# Patient Record
Sex: Male | Born: 1970 | Race: White | Hispanic: No | Smoking: Never smoker
Health system: Southern US, Community
[De-identification: ages and names within clinical notes are randomized; demographics above are authoritative.]

## PROBLEM LIST (undated history)

## (undated) DIAGNOSIS — K219 Gastro-esophageal reflux disease without esophagitis: Secondary | ICD-10-CM

## (undated) HISTORY — PX: BACK SURGERY: SHX140

## (undated) HISTORY — PX: ROTATOR CUFF REPAIR: SHX139

## (undated) HISTORY — DX: Gastro-esophageal reflux disease without esophagitis: K21.9

---

## 1997-09-13 HISTORY — PX: ARTHROSCOPIC REPAIR ACL: SUR80

## 1998-03-25 ENCOUNTER — Emergency Department (HOSPITAL_COMMUNITY): Admission: EM | Admit: 1998-03-25 | Discharge: 1998-03-25 | Payer: Self-pay | Admitting: Emergency Medicine

## 1998-05-08 ENCOUNTER — Ambulatory Visit (HOSPITAL_BASED_OUTPATIENT_CLINIC_OR_DEPARTMENT_OTHER): Admission: RE | Admit: 1998-05-08 | Discharge: 1998-05-08 | Payer: Self-pay | Admitting: Orthopedic Surgery

## 1999-11-25 ENCOUNTER — Ambulatory Visit (HOSPITAL_COMMUNITY): Admission: RE | Admit: 1999-11-25 | Discharge: 1999-11-25 | Payer: Self-pay | Admitting: Family Medicine

## 1999-11-25 ENCOUNTER — Encounter: Payer: Self-pay | Admitting: Family Medicine

## 1999-12-08 ENCOUNTER — Emergency Department (HOSPITAL_COMMUNITY): Admission: EM | Admit: 1999-12-08 | Discharge: 1999-12-09 | Payer: Self-pay | Admitting: *Deleted

## 1999-12-09 ENCOUNTER — Emergency Department (HOSPITAL_COMMUNITY): Admission: EM | Admit: 1999-12-09 | Discharge: 1999-12-09 | Payer: Self-pay | Admitting: *Deleted

## 2001-05-12 ENCOUNTER — Encounter: Payer: Self-pay | Admitting: Neurosurgery

## 2001-05-12 ENCOUNTER — Inpatient Hospital Stay (HOSPITAL_COMMUNITY): Admission: RE | Admit: 2001-05-12 | Discharge: 2001-05-12 | Payer: Self-pay | Admitting: Neurosurgery

## 2003-11-28 ENCOUNTER — Encounter: Admission: RE | Admit: 2003-11-28 | Discharge: 2003-11-28 | Payer: Self-pay | Admitting: Neurosurgery

## 2003-12-20 ENCOUNTER — Encounter: Admission: RE | Admit: 2003-12-20 | Discharge: 2003-12-20 | Payer: Self-pay | Admitting: Neurosurgery

## 2004-01-09 ENCOUNTER — Encounter: Admission: RE | Admit: 2004-01-09 | Discharge: 2004-01-09 | Payer: Self-pay | Admitting: Neurosurgery

## 2010-06-03 ENCOUNTER — Ambulatory Visit: Payer: Self-pay | Admitting: Family Medicine

## 2010-06-03 DIAGNOSIS — M109 Gout, unspecified: Secondary | ICD-10-CM | POA: Insufficient documentation

## 2010-06-03 DIAGNOSIS — I10 Essential (primary) hypertension: Secondary | ICD-10-CM

## 2010-06-03 DIAGNOSIS — K219 Gastro-esophageal reflux disease without esophagitis: Secondary | ICD-10-CM

## 2010-06-03 DIAGNOSIS — M549 Dorsalgia, unspecified: Secondary | ICD-10-CM | POA: Insufficient documentation

## 2010-06-04 ENCOUNTER — Encounter: Payer: Self-pay | Admitting: Family Medicine

## 2010-08-03 ENCOUNTER — Telehealth: Payer: Self-pay | Admitting: Family Medicine

## 2010-08-11 ENCOUNTER — Telehealth: Payer: Self-pay | Admitting: Family Medicine

## 2010-09-30 ENCOUNTER — Ambulatory Visit
Admission: RE | Admit: 2010-09-30 | Discharge: 2010-09-30 | Payer: Self-pay | Source: Home / Self Care | Attending: Family Medicine | Admitting: Family Medicine

## 2010-09-30 DIAGNOSIS — L0291 Cutaneous abscess, unspecified: Secondary | ICD-10-CM | POA: Insufficient documentation

## 2010-09-30 DIAGNOSIS — L0390 Cellulitis, unspecified: Secondary | ICD-10-CM

## 2010-10-13 NOTE — Progress Notes (Signed)
Summary: exforge   Phone Note Refill Request Message from:  Fax from Pharmacy on August 03, 2010 1:31 PM  Refills Requested: Medication #1:  EXFORGE 5-160 MG TABS take one tablet by mouth daily. Uses [facility]. This was prescribed by patient's previous dr. Is this okay to fill.   Initial call taken by: Melody Comas,  August 03, 2010 1:31 PM  Follow-up for Phone Call        yes ok to refill. Ruthe Mannan MD  August 03, 2010 1:34 PM     Prescriptions: EXFORGE 5-160 MG TABS (AMLODIPINE BESYLATE-VALSARTAN) take one tablet by mouth daily  #90 x 3   Entered by:   Melody Comas   Authorized by:   Ruthe Mannan MD   Signed by:   Melody Comas on 08/03/2010   Method used:   Faxed to ...       [facility] (mail-[facility])             , [place]         Ph: [phone]       Fax: [phone]   RxID:   [id number]

## 2010-10-13 NOTE — Letter (Signed)
Summary: Generic Letter  [place]  [address] [place], [place] [address]   Phone: [phone]  Fax: [phone]    06/04/2010  Jerry Carpenter [address] [place], [place]  [address]  Dear [name],    We have received your lab results and Dr. Dayton Martes says that your (LDL) bad cholesterol looks great!  Keep up the good work.  Enclosed you will find a copy of your lab results.       Sincerely,        Linde Gillis CMA (AAMA)for Dr. Ruthe Mannan, MD

## 2010-10-13 NOTE — Progress Notes (Signed)
Summary: exforge  Phone Note Call from Patient Call back at Home Phone [phone]   Caller: Patient Call For: Ruthe Mannan MD Summary of Call: Received letter from [facility] regarding the exforge rx, they said that he hasn't used [facility] before so he is going to need a written rx for the exforge to mail to [facility]. Please call patient when rx is ready.  Initial call taken by: Melody Comas,  August 11, 2010 1:10 PM  Follow-up for Phone Call        signed printed and given to [facility]. Follow-up by: Eustaquio Boyden  MD,  August 11, 2010 1:14 PM  Additional Follow-up for Phone Call Additional follow up Details #1::        Message left notifying patient to pick up Rx Additional Follow-up by: Janee Morn CMA Duncan Dull),  August 11, 2010 1:29 PM    Prescriptions: EXFORGE 5-160 MG TABS (AMLODIPINE BESYLATE-VALSARTAN) take one tablet by mouth daily  #90 x 3   Entered and Authorized by:   Eustaquio Boyden  MD   Signed by:   Eustaquio Boyden  MD on 08/11/2010   Method used:   Print then Give to Patient   RxID:   [id number]   Appended Document: exforge Patient came in to pick up Rx and was questioning why he had to fax in the hard copy himself because he is a [facility] member and gets his refills from them regularly.  Called [facility] and gave the Rx verbally to [facility]. with no problem.  She did update the patients address in there computer system.

## 2010-10-13 NOTE — Assessment & Plan Note (Signed)
Summary: NEW PATIENT/[facility]   Vital Signs:  Patient profile:   40 year old male Height:      70 inches Weight:      315 pounds BMI:     45.36 Temp:     98.1 degrees F oral Pulse rate:   76 / minute Pulse rhythm:   regular BP sitting:   120 / 90  (right arm) Cuff size:   large  Vitals Entered By: Linde Gillis CMA Duncan Dull) (June 03, 2010 9:29 AM) CC: new patient, establish care   History of Present Illness: 40 yo here to establish care.  HTN-  has been well controlled on Exforge.  Cannot take diuretics due to gout.  Had a cough with ACEI. No CP, blurred vision, SOB, LE edema.  Gout- flares typically in toes, ankles and knees.  Fish is a big trigger for him.  Takes colchicine for flares.  Has not had a bad one in months.  H/o abnormal EKG- per pt, negative myoview in 06/2009 (awaiting records).  No h/o CAD, CP.  Bilateral numbness in buttocks/hip area- occurs after he has been in the car for awhile.  Does not radiate down leg or to groin.  No pain or LE weakness.  No urinary symptoms.  usually goes away after he gets out of the car and walks around for awhile.    Preventive Screening-Counseling & Management  Alcohol-Tobacco     Smoking Status: never  Current Medications (verified): 1)  Exforge 5-160 Mg Tabs (Amlodipine Besylate-Valsartan) .... Take One Tablet By Mouth Daily  Allergies (verified): 1)  ! 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 Divorced Never Smoked Alcohol use-yes  Risk Factors: Smoking Status: never (06/03/2010)  Past Medical History: abnormal EKG- followed by Dr. 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 Divorced Never Smoked Alcohol use-yes Smoking Status:  never  Review of Systems      See HPI General:  Denies malaise. Eyes:  Denies blurring. ENT:  Denies difficulty swallowing. CV:  Denies chest pain or discomfort. Resp:  Denies shortness of breath. GI:  Denies abdominal pain, nausea, and vomiting. GU:  Denies dysuria, incontinence, urinary frequency, and urinary hesitancy. MS:  Complains of low back pain; denies loss of strength. Derm:  Denies rash. Neuro:  Denies falling down and headaches. Psych:  Denies anxiety and depression. Endo:  Denies cold intolerance and heat intolerance. Heme:  Denies abnormal bruising and bleeding.  Physical Exam  General:  alert and overweight-appearing.   Head:  normocephalic and atraumatic.   Eyes:  vision grossly intact, pupils equal, pupils round, and pupils reactive to light.   Ears:  R ear normal and L ear normal.   Nose:  no external deformity.   Mouth:  good dentition.   Lungs:  Normal respiratory effort, chest expands symmetrically. Lungs are clear to auscultation, no crackles or wheezes. Heart:  Normal rate and regular rhythm. S1 and S2 normal without gallop, murmur, click, rub or other extra sounds. Abdomen:  Bowel  sounds positive,abdomen soft and non-tender without masses, organomegaly or hernias noted. Msk:  No deformity or scoliosis noted of thoracic or lumbar spine.   SLR, fabers, neg bilaterally. Strength equal in four extremities. Extremities:  no edema Neurologic:  alert & oriented X3, gait normal, and DTRs symmetrical and normal.   Skin:  Intact without suspicious lesions or rashes Psych:  Cognition and judgment appear intact. Alert and cooperative with normal attention span and  concentration. No apparent delusions, illusions, hallucinations   Impression & Recommendations:  Problem # 1:  GOUT (ICD-274.9) Assessment Unchanged Currently stable.  Continue as needed colchicine.  Discussed keeping journal of triggers if symptoms become more frequent.  Problem # 2:  HYPERTENSION (ICD-401.9) Assessment: Unchanged Stable on Exforge.  continue current dose.  Awaiting records for recent labs. His updated medication list for this problem includes:    Exforge 5-160 Mg Tabs (Amlodipine besylate-valsartan) .Marland Kitchen... Take one tablet by mouth daily  Problem # 3:  FAMILY HISTORY OF CAD MALE 1ST DEGREE RELATIVE <50 (ICD-V17.3) Assessment: Unchanged Strong FH of CAD, will recheck direct LDL today.  Problem # 4:  BACK PAIN (ICD-724.5) Assessment: New Most consistent with spasm/piriformis sydrome.  Discussed stretches and NSAIDs.  Pt in agreement with plan.  Complete Medication List: 1)  Exforge 5-160 Mg Tabs (Amlodipine besylate-valsartan) .... Take one tablet by mouth daily  Other Orders: TLB-Cholesterol, Direct LDL (83721-DIRLDL) Venipuncture (44010) Flu Vaccine 66yrs + (27253) Admin 1st Vaccine (66440)  Current Allergies (reviewed today): ! ACE INHIBITORS   Immunization History:  Hepatitis B Immunization History:    Hepatitis B # 1:  historical (01/07/1997)    Hepatitis B # 2:  historical (02/19/1997)    Hepatitis B # 3:  historical (07/17/1997)    Hepatitis B # 4:  historical (08/26/2008)  DPT Immunization History:    DPT # 1:  historical (10/28/2005)  Immunizations Administered:  Influenza Vaccine # 1:    Vaccine Type: Fluvax 3+    Site: left deltoid    Mfr: GlaxoSmithKline    Dose: 0.5 ml    Route: IM    Given by: Linde Gillis CMA (AAMA)    Exp. Date: 03/13/2011    Lot #: [id number]    VIS given: 04/07/10 version given June 03, 2010.   Prevention & Chronic Care Immunizations   Influenza vaccine: Fluvax 3+  (06/03/2010)    Tetanus booster:  06/02/2006: historical   Tetanus booster due: 06/02/2016    Pneumococcal vaccine: Not documented  Other Screening   Smoking status: never  (06/03/2010)  Lipids   Total Cholesterol: Not documented   Lipid panel action/deferral: LDL Direct Ordered   LDL: Not documented   LDL Direct: Not documented   HDL: Not documented   Triglycerides: Not documented  Hypertension   Last Blood Pressure: 120 / 90  (06/03/2010)   Serum creatinine: Not documented   Serum potassium Not documented  Self-Management Support :    Hypertension self-management support: Not documented   Nursing Instructions: Give Flu vaccine today    TD Result Date:  06/02/2006 TD Result:  historical

## 2010-10-15 NOTE — Assessment & Plan Note (Signed)
Summary: KNOT ON RIGHT ARM TRICEP IN THE MUSCLE / LFW   Vital Signs:  Patient profile:   41 year old male Height:      70 inches Weight:      322 pounds BMI:     46.37 Temp:     97.9 degrees F oral Pulse rate:   75 / minute Pulse rhythm:   regular BP sitting:   120 / 82  (right arm) Cuff size:   large  Vitals Entered By: Linde Gillis CMA Duncan Dull) (September 30, 2010 11:32 AM) CC: knot on right arm   History of Present Illness: 40 yo here for knot on right for several days.  Right above right elbow- was very large when it started, become very red and warm to touch. Painful.  Felt a little feverish. No nausea or vomiting.  Has not placed warm compresses on it. Pain has improved, redness has resolved.     Current Medications (verified): 1)  Exforge 5-160 Mg Tabs (Amlodipine Besylate-Valsartan) .... Take One Tablet By Mouth Daily 2)  Septra Ds 800-160 Mg Tabs (Sulfamethoxazole-Trimethoprim) .Marland Kitchen.. 1 By Mouth Twice Daily X 10 Days 3)  Cialis 20 Mg Tabs (Tadalafil) .Marland Kitchen.. 1 Tablet Every Other Day As Needed For Erectile Dysfunction  Allergies: 1)  ! Ace Inhibitors  Past History:  Past Medical History: Last updated: 06/03/2010 abnormal EKG- followed by Dr. 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 Divorced Never Smoked Alcohol use-yes  Risk Factors: Smoking Status: never (06/03/2010)  Review of Systems      See HPI General:  Complains of chills and malaise. GI:  Denies nausea and vomiting. Derm:  Denies poor wound healing.  Physical Exam  General:  alert and overweight-appearing.   VSS,  afebrile Psych:  Right upper arm- palpable subcutaneous 2.5 cm mass, non fluctuant, no overlying warmth or erythema, mildly TTP   Impression & Recommendations:  Problem # 1:  ABSCESS, SKIN (ICD-682.9) Assessment New Most likely an absecess with resolving cellulitis. Although use of abx in a case like this is controversial, will try given how deep the mass is. Advised warm compresses. See pt instructions. His updated medication list for this problem includes:    Septra Ds 800-160 Mg Tabs (Sulfamethoxazole-trimethoprim) .Marland Kitchen... 1 by mouth twice daily x 10 days  Complete Medication List: 1)  Exforge 5-160 Mg Tabs (Amlodipine besylate-valsartan) .... Take one tablet by mouth daily 2)  Septra Ds 800-160 Mg Tabs (Sulfamethoxazole-trimethoprim) .Marland Kitchen.. 1 by mouth twice daily x 10 days 3)  Cialis 20 Mg Tabs (Tadalafil) .Marland Kitchen.. 1 tablet every other day as needed for erectile dysfunction  Patient Instructions: 1)  Please call me on Friday with an update. 2)  Come here or to urgent sooner if you develop fever, vomiting. Prescriptions: CIALIS 20 MG TABS (TADALAFIL) 1 tablet every other day as needed for erectile dysfunction  #30 x 3   Entered and Authorized by:   Ruthe Mannan MD   Signed by:   Ruthe Mannan MD on 09/30/2010   Method used:   Faxed to ...       [facility] (mail-[facility])             , [place]  Ph: [phone]       Fax: [phone]   RxID:   [id number] SEPTRA DS 800-160 MG TABS (SULFAMETHOXAZOLE-TRIMETHOPRIM) 1 by mouth twice daily x 10 days  #20 x 0   Entered and Authorized by:   Ruthe Mannan MD   Signed by:   Ruthe Mannan MD on 09/30/2010   Method used:   Electronically to        [facility] [id number] (retail[facility]       [address]       [place], [place]  [address]       Ph: [phone] or [phone]       Fax: [phone]   RxID:   [id number]    Orders Added: 1)  Est. Patient Level IV [01601]    Current Allergies (reviewed today): ! ACE INHIBITORS

## 2011-01-26 ENCOUNTER — Other Ambulatory Visit: Payer: Self-pay | Admitting: *Deleted

## 2011-01-26 MED ORDER — COLCHICINE 0.6 MG PO TABS: 0.6000 mg | ORAL_TABLET | Freq: Every day | ORAL | Status: DC

## 2011-01-26 NOTE — Telephone Encounter (Signed)
Pt is asking that a script for colchicine be sent to [facility].  He says he has gotten this from you before but I dont see it on his med list.

## 2011-01-26 NOTE — Telephone Encounter (Signed)
Rx sent 

## 2011-01-29 NOTE — Op Note (Signed)
[employer]  Patient:    Jerry Carpenter, Jerry Carpenter Visit Number: [id number] MRN: [id number]          Service Type: Attending:  Julio Sicks, M.D. Dictated by:   Julio Sicks, M.D. Proc. 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  PROCEDURE:  Left L5-S1 laminotomy and microdiskectomy.  SURGEON:  Julio Sicks, M.D.  ASSISTANT:  Reinaldo Meeker, M.D.  ANESTHESIA:  General endotracheal.  INDICATIONS:  [name] is a 40 year old male with history of back and left lower extremity pain (with left-sided S1 radiculopathy).  This failed conservative management.  MRI scanning demonstrates a very large and left paracentral disk herniation, with compression of the cauda equina and left-sided S1 nerve root. The patient has been counseled as to the options and benefits.  Decided to proceed with the L5-S1 laminotomy and microdiskectomy for hopeful improvement of his symptoms.  OPERATIVE NOTE:  The patient taken to the operating room and placed in the supine position.  After adequate level of anesthesia was achieved, the patient was positioned prone onto Wilson frame and appropriately padded.  Preparation of the lumbar region was shaved, prepped and draped sterilely.  We then made a ______ linear skin incision overlying the L5-S1 interspace.  This was carried down sharply in the midline.  Subperiosteal dissection was performed on the left side, exposing the lamina and facet joints at L5-S1.  Deep self-retaining retractor was placed.  Intraoperative x-rays were taken.  The level was confirmed.  The laminotomy was then performed using a high-speed drill and Kerrison rongeurs ______ of the lamina at L5.  The medial edge of the L5-S1 facet joint and superior rim of the S1 lamina at the  ligament of Flavum was then elevated and resected in piecemeal fashion using Kerrison rongeurs, with the underlying thecal sac.  Next, the S1 nerve root was clearly identified.  Microscope was brought into the field and used for microdissection of the left-sided S1 nerve root and underlying disk herniation.  Epidural venous plexus was coagulated and cut. Thecal sac and S4 nerve root were mobilized and tracked towards the midline. The disk herniation was readily apparent.  We then used a size 15 blade to remove in piecemeal fashion.  A large amount of disk herniation then extruded at this point.  This was removed using pituitary rongeurs upward and Epstein curets.  All loose or obvious seen degenerative disk herniations were removed from the interspace.  All muscles of the disk herniation were completely resected.  At this point a very thorough diskectomy had been performed.  There was no loose debris and complete decompression of the thecal sac or nerve roots.  The wound was then copiously irrigated with saline solution.  The wound was inspected one final time for hemostasis, found to be good.  The microscope and retractors were removed.  Hemostasis of the musculature achieved with electrocautery.  The wound was then closed in layers with Vicryl sutures.  Steri-Strips and a sterile dressing were applied.  There were no intraoperative complications.  The patient tolerated the procedure well and he was returned to the recovery room postop. Dictated by:   Julio Sicks, M.D. Attending:  Julio Sicks, M.D. DD:  05/12/01 TD:  05/12/01  Job: [id number]

## 2011-02-11 ENCOUNTER — Other Ambulatory Visit: Payer: Self-pay | Admitting: *Deleted

## 2011-02-11 MED ORDER — COLCHICINE 0.6 MG PO TABS: 0.6000 mg | ORAL_TABLET | Freq: Every day | ORAL | Status: DC

## 2011-02-11 NOTE — Telephone Encounter (Signed)
Pt states the script that was sent in on 5/15 was cancelled because he owed [facility] a payment, which has since been paid.  Also, pt is asking for a 90 day supply.

## 2011-02-19 ENCOUNTER — Encounter: Payer: Self-pay | Admitting: Family Medicine

## 2011-02-23 ENCOUNTER — Encounter: Payer: Self-pay | Admitting: Family Medicine

## 2011-02-23 ENCOUNTER — Ambulatory Visit (INDEPENDENT_AMBULATORY_CARE_PROVIDER_SITE_OTHER): Payer: BC Managed Care – PPO | Admitting: Family Medicine

## 2011-02-23 VITALS — BP 130/90 | HR 76 | Temp 97.9°F | Ht 70.0 in | Wt 317.5 lb

## 2011-02-23 DIAGNOSIS — R0989 Other specified symptoms and signs involving the circulatory and respiratory systems: Secondary | ICD-10-CM

## 2011-02-23 DIAGNOSIS — R0683 Snoring: Secondary | ICD-10-CM

## 2011-02-23 DIAGNOSIS — R195 Other fecal abnormalities: Secondary | ICD-10-CM

## 2011-02-23 DIAGNOSIS — R079 Chest pain, unspecified: Secondary | ICD-10-CM | POA: Insufficient documentation

## 2011-02-23 NOTE — Progress Notes (Signed)
  History of Present Illness:  40 yo here for several issues: 1.  Hospital follow up: Per pt, was admitted to [facility] last week for chest pain. Chest pain has resolved.  Per pt, CE neg, stress test neg. Was told it was reflux/MSK. Symptoms have resolved.  2.  GI issues- Since taking colchicine, bowels have been loose.  Noticed that when he doesn't take it, he still has frequent bowel movements and he is concerned there may be another issue. No abdominal pain.  No nausea.  No visible blood in stool. Admits to not eating well, greasy/fast food.  3.  Snoring- as he is gaining weight, snoring is getting worse. Also feels like even after he gets a full night of sleep, very tired during the day. Can fall asleep in the middle of meetings, etc. Does have h/o HTN.   The PMH, PSH, Social History, Family History, Medications, and allergies have been reviewed in [facility], and have been updated if relevant.  Review of Systems  See HPI  General: Complains of chills and malaise.  GI: Denies nausea and vomiting.  Derm: Denies poor wound healing.    Physical Exam  BP 130/90  Pulse 76  Temp(Src) 97.9 F (36.6 C) (Oral)  Ht 5\' 10"  (1.778 m)  Wt 317 lb 8 oz (144.017 kg)  BMI 45.56 kg/m2  General: alert and overweight-appearing.  HEENT:  Neck supple, no adenopathy CVS:  RRR Resp:  CTA bilaterally Abd:  Obese, pos BS, NT Psych: Right upper arm- palpable subcutaneous 2.5 cm mass, non fluctuant, no overlying warmth or erythema, mildly TTP  1. Snoring   Given body habitus, HTN and hypersomnia, will refer to pulmonary for sleep study to rule out sleep apnea.    2. Loose stools  Likely multifactorial- related to combination of colchicine and poor diet. Will get IFOB to rule out any microscopic bleeding.   The patient indicates understanding of these issues and agrees with the plan.    3. Chest pain  Resolved.  Awaiting records from [facility]. Per pt, cardiac work up was extensive and negative. No  further work up is likely necessary.

## 2011-02-23 NOTE — Patient Instructions (Signed)
Good to see you. Please stop by to see Jerry Carpenter on your way out. 

## 2011-02-26 ENCOUNTER — Encounter: Payer: Self-pay | Admitting: *Deleted

## 2011-02-26 ENCOUNTER — Other Ambulatory Visit: Payer: Self-pay | Admitting: Family Medicine

## 2011-02-26 ENCOUNTER — Other Ambulatory Visit: Payer: BC Managed Care – PPO

## 2011-02-26 DIAGNOSIS — Z1211 Encounter for screening for malignant neoplasm of colon: Secondary | ICD-10-CM

## 2011-02-26 LAB — FECAL OCCULT BLOOD, IMMUNOCHEMICAL: Fecal Occult Bld: NEGATIVE

## 2011-03-15 ENCOUNTER — Encounter: Payer: Self-pay | Admitting: Pulmonary Disease

## 2011-03-15 ENCOUNTER — Ambulatory Visit (INDEPENDENT_AMBULATORY_CARE_PROVIDER_SITE_OTHER): Payer: BC Managed Care – PPO | Admitting: Pulmonary Disease

## 2011-03-15 VITALS — BP 132/86 | HR 70 | Temp 97.7°F | Ht 70.0 in | Wt 322.0 lb

## 2011-03-15 DIAGNOSIS — G4733 Obstructive sleep apnea (adult) (pediatric): Secondary | ICD-10-CM

## 2011-03-15 NOTE — Assessment & Plan Note (Signed)
The pt's history is very suggestive of osa.  He has is obese, has a large neck, snores, has nonrestorative sleep, and has significant daytime sleepiness.  I have had a long discussion with the pt about sleep apnea, including its impact on QOL and CV health.  I think he needs a sleep study for diagnosis, and the pt is agreeable.

## 2011-03-15 NOTE — Progress Notes (Signed)
  Subjective:    Patient ID: Jerry Carpenter, male    DOB: [birth year], 40 y.o.   MRN: [id number]  HPI The pt is a 40y/o male who I have been asked to see for possible osa.  His history is significant for: -loud snoring, no bedpartner currently to note apneas -nonrestorative sleep, and significant daytime sleepiness with any period of inactivity.  He can fall asleep with meetings, conversations, stoplights, and long distance daytime driving.  He gets his "second wind" in the evenings -weight up 20 pounds in last two years, and epworth very abnormal at 18  Sleep Questionnaire: What time do you typically go to bed?( Between what hours) 11pm to 12:30 am How long does it take you to fall asleep? within minutes How many times during the night do you wake up? 0 What time do you get out of bed to start your day? 0530 Do you drive or operate heavy machinery in your occupation? No How much has your weight changed (up or down) over the past two years? (In pounds) 20 lb (9.072 kg) Have you ever had a sleep study before? No Do you currently use CPAP? No Do you wear oxygen at any time? No     Review of Systems  Constitutional: Negative for fever and unexpected weight change.  HENT: Positive for congestion. Negative for ear pain, nosebleeds, sore throat, rhinorrhea, sneezing, trouble swallowing, dental problem, postnasal drip and sinus pressure.   Eyes: Negative for redness and itching.  Respiratory: Positive for shortness of breath. Negative for cough, chest tightness and wheezing.   Cardiovascular: Positive for leg swelling. Negative for palpitations.  Gastrointestinal: Negative for nausea and vomiting.  Genitourinary: Negative for dysuria.  Musculoskeletal: Positive for joint swelling.  Skin: Negative for rash.  Neurological: Positive for headaches.  Hematological: Does not bruise/bleed easily.  Psychiatric/Behavioral: Negative for dysphoric mood. The patient is not nervous/anxious.        Objective:   Physical Exam Constitutional: obese male, no acute distress  HENT:  Nares patent without discharge, but deviated septum to right with narrowing  Oropharynx without exudate, palate and uvula are very large with excessive soft tissue posteriorly  Eyes:  Perrla, eomi, no scleral icterus  Neck:  No JVD, no TMG.  Very large neck  Cardiovascular:  Normal rate, regular rhythm, no rubs or gallops.  No murmurs        Intact distal pulses  Pulmonary :  Normal breath sounds, no stridor or respiratory distress   No rales, rhonchi, or wheezing  Abdominal:  Soft, nondistended, bowel sounds present.  No tenderness noted.   Musculoskeletal:  No lower extremity edema noted.  Lymph Nodes:  No cervical lymphadenopathy noted  Skin:  No cyanosis noted  Neurologic:  Alert, appropriate, moves all 4 extremities without obvious deficit.         Assessment & Plan:

## 2011-03-15 NOTE — Patient Instructions (Addendum)
Will schedule for sleep study, and arrange for followup once results are available.   Work on weight loss.

## 2011-03-16 ENCOUNTER — Ambulatory Visit (HOSPITAL_BASED_OUTPATIENT_CLINIC_OR_DEPARTMENT_OTHER): Payer: BC Managed Care – PPO | Attending: Pulmonary Disease

## 2011-03-16 DIAGNOSIS — G4733 Obstructive sleep apnea (adult) (pediatric): Secondary | ICD-10-CM | POA: Insufficient documentation

## 2011-03-18 DIAGNOSIS — G4733 Obstructive sleep apnea (adult) (pediatric): Secondary | ICD-10-CM

## 2011-03-19 NOTE — Procedures (Signed)
Jerry Carpenter, Jerry Carpenter                 ACCOUNT NO.:  [id number]  MEDICAL RECORD NO.:  [id number]          PATIENT TYPE:  OUT  LOCATION:  SLEEP CENTER                 FACILITY:  [facility]  PHYSICIAN:  Barbaraann Share, MD,FCCPDATE OF BIRTH:  02-Dec-1969  DATE OF STUDY:  03/16/2011                           NOCTURNAL POLYSOMNOGRAM  REFERRING PHYSICIAN:  Barbaraann Share, MD,FCCP  INDICATION FOR STUDY:  Hypersomnia with sleep apnea.  EPWORTH SLEEPINESS SCORE:  17.  MEDICATIONS:  SLEEP ARCHITECTURE:  The patient had a total sleep time of 293 minutes with very little slow wave sleep and only 26 minutes of REM.  Sleep onset latency was normal at 5 minutes, and REM onset was at the upper limits of normal at 127 minutes.  Sleep efficiency was moderately reduced at 75%.  RESPIRATORY DATA:  The patient was found to have 80 obstructive apneas and 119 obstructive hypopneas, giving him an apnea/hypopnea index of 41 events per hour.  The events occurred primarily in the supine position, and there was moderate to loud snoring noted throughout.  OXYGEN DATA:  There was O2 desaturation as low as 79% with the patient's obstructive events.  CARDIAC DATA:  No clinically significant arrhythmias were noted.  MOVEMENT-PARASOMNIA:  The patient had no significant leg jerks or other abnormal behavior seen.  IMPRESSIONS-RECOMMENDATIONS:  Severe obstructive sleep apnea/hypopnea syndrome with an apnea/hypopnea index of 41 events per hour and O2 desaturation as low as 79%.  Treatment for this degree of sleep apnea should focus primarily on weight loss as well as CPAP.     Barbaraann Share, MD,FCCP Diplomate, American Board of Sleep Medicine Electronically Signed    [facility]  D:  03/18/2011 15:22:40  T:  03/19/2011 05:12:43  Job:  [id number]

## 2011-03-23 ENCOUNTER — Encounter (HOSPITAL_BASED_OUTPATIENT_CLINIC_OR_DEPARTMENT_OTHER): Payer: BC Managed Care – PPO

## 2011-03-30 ENCOUNTER — Encounter (HOSPITAL_BASED_OUTPATIENT_CLINIC_OR_DEPARTMENT_OTHER): Payer: BC Managed Care – PPO

## 2011-03-31 ENCOUNTER — Ambulatory Visit: Payer: BC Managed Care – PPO | Admitting: Pulmonary Disease

## 2011-04-23 ENCOUNTER — Ambulatory Visit: Payer: BC Managed Care – PPO | Admitting: Pulmonary Disease

## 2011-04-26 ENCOUNTER — Encounter: Payer: Self-pay | Admitting: Pulmonary Disease

## 2011-04-26 ENCOUNTER — Ambulatory Visit (INDEPENDENT_AMBULATORY_CARE_PROVIDER_SITE_OTHER): Payer: BC Managed Care – PPO | Admitting: Pulmonary Disease

## 2011-04-26 VITALS — BP 130/78 | HR 78 | Temp 97.5°F | Ht 70.0 in | Wt 327.6 lb

## 2011-04-26 DIAGNOSIS — G4733 Obstructive sleep apnea (adult) (pediatric): Secondary | ICD-10-CM

## 2011-04-26 NOTE — Assessment & Plan Note (Signed)
The patient has been found to have severe obstructive sleep apnea by his recent sleep study.  His best treatment at this time would be weight loss while wearing CPAP, and he is amenable to this.  I will set the patient up on cpap at a moderate pressure level to allow for desensitization, and will troubleshoot the device over the next 4-6weeks if needed.  The pt is to call me if having issues with tolerance.  Will then optimize the pressure once patient is able to wear cpap on a consistent basis.

## 2011-04-26 NOTE — Patient Instructions (Signed)
Will start on cpap.  Please call if having tolerance issues. Work on weight loss followup with me in 5 weeks.  

## 2011-04-26 NOTE — Progress Notes (Signed)
  Subjective:    Patient ID: Jerry Carpenter, male    DOB: 05/20/1971, 40 y.o.   MRN: [id number]  HPI The patient comes in today for followup after his recent sleep study.  He was found to have severe obstructive sleep apnea, with an AHI of 41 events per hour.  I have reviewed his sleep study in detail with him, and answered all of his questions.   Review of Systems  Constitutional: Negative for fever and unexpected weight change.  HENT: Negative for ear pain, nosebleeds, congestion, sore throat, rhinorrhea, sneezing, trouble swallowing, dental problem, postnasal drip and sinus pressure.   Eyes: Negative for redness and itching.  Respiratory: Negative for cough, chest tightness, shortness of breath and wheezing.   Cardiovascular: Negative for palpitations and leg swelling.  Gastrointestinal: Negative for nausea and vomiting.  Genitourinary: Negative for dysuria.  Musculoskeletal: Negative for joint swelling.  Skin: Negative for rash.  Neurological: Negative for headaches.  Hematological: Does not bruise/bleed easily.  Psychiatric/Behavioral: Negative for dysphoric mood. The patient is not nervous/anxious.        Objective:   Physical Exam Obese male in no acute distress Nose without obvious discharge or purulence Lower extremities without edema, no cyanosis present Alert and oriented, moves all 4 extremities.       Assessment & Plan:

## 2011-05-31 ENCOUNTER — Ambulatory Visit (INDEPENDENT_AMBULATORY_CARE_PROVIDER_SITE_OTHER): Payer: BC Managed Care – PPO | Admitting: Pulmonary Disease

## 2011-05-31 ENCOUNTER — Encounter: Payer: Self-pay | Admitting: Pulmonary Disease

## 2011-05-31 VITALS — BP 130/80 | HR 77 | Temp 97.7°F | Ht 70.0 in | Wt 329.0 lb

## 2011-05-31 DIAGNOSIS — G4733 Obstructive sleep apnea (adult) (pediatric): Secondary | ICD-10-CM

## 2011-05-31 NOTE — Progress Notes (Signed)
  Subjective:    Patient ID: Jerry Carpenter, male    DOB: 08/11/1971, 40 y.o.   MRN: [id number]  HPI The patient comes in today for followup of his obstructive sleep apnea.  He has been wearing CPAP compliantly since last visit, and denies any issues with his mask fit.  He is having an issue with pulling the mask off during the night, and feels that he is having breakthrough events.  I have explained that we have yet to optimize his pressure for him.  Even with this, he has seen an improvement in his sleep, and also his daytime alertness.   Review of Systems  Constitutional: Negative for fever and unexpected weight change.  HENT: Positive for congestion and sore throat. Negative for ear pain, nosebleeds, rhinorrhea, sneezing, trouble swallowing, dental problem, postnasal drip and sinus pressure.   Eyes: Positive for redness and itching.  Respiratory: Positive for cough. Negative for chest tightness, shortness of breath and wheezing.   Cardiovascular: Negative for palpitations and leg swelling.  Gastrointestinal: Negative for nausea and vomiting.  Genitourinary: Negative for dysuria.  Musculoskeletal: Negative for joint swelling.  Skin: Negative for rash.  Neurological: Negative for headaches.  Hematological: Does not bruise/bleed easily.  Psychiatric/Behavioral: Negative for dysphoric mood. The patient is not nervous/anxious.        Objective:   Physical Exam An obese male in no acute distress No skin breakdown or pressure necrosis from CPAP mask Nose without purulence or discharge noted Chest clear to auscultation Cardiac exam with regular rate and rhythm Lower extremities without edema, no cyanosis noted Alert and oriented, does not appear to be sleepy, moves all 4 extremities.       Assessment & Plan:

## 2011-05-31 NOTE — Assessment & Plan Note (Signed)
The patient overall has adapted well to CPAP, and has seen improvement in his sleep and daytime alertness.  I suspect he is pulling his mask off during the night because of breakthrough apneas, and I have explained to him we need to optimize his pressure at this time.  I have also encouraged him to work aggressively on weight loss. Care Plan:  At this point, will arrange for the patient's machine to be changed over to auto mode for 2 weeks to optimize their pressure.  I will review the downloaded data once sent by dme, and also evaluate for compliance, leaks, and residual osa.  I will call the patient and dme to discuss the results, and have the patient's machine set appropriately.  This will serve as the pt's cpap pressure titration.

## 2011-05-31 NOTE — Patient Instructions (Signed)
We'll arrange to have your CPAP pressure optimized over the next few weeks.  I will call you once I receive your download results Work on weight loss Followup with me in 6 months, or sooner if having issues.

## 2011-06-22 ENCOUNTER — Other Ambulatory Visit: Payer: Self-pay | Admitting: Family Medicine

## 2011-06-22 NOTE — Telephone Encounter (Signed)
Will route to PCP 

## 2011-06-23 ENCOUNTER — Ambulatory Visit: Payer: BC Managed Care – PPO | Admitting: Family Medicine

## 2011-06-24 ENCOUNTER — Ambulatory Visit (INDEPENDENT_AMBULATORY_CARE_PROVIDER_SITE_OTHER): Payer: BC Managed Care – PPO | Admitting: Family Medicine

## 2011-06-24 ENCOUNTER — Encounter: Payer: Self-pay | Admitting: Family Medicine

## 2011-06-24 ENCOUNTER — Encounter: Payer: Self-pay | Admitting: *Deleted

## 2011-06-24 ENCOUNTER — Telehealth: Payer: Self-pay | Admitting: Pulmonary Disease

## 2011-06-24 VITALS — BP 130/80 | HR 96 | Temp 98.2°F | Ht 70.0 in | Wt 330.8 lb

## 2011-06-24 DIAGNOSIS — J189 Pneumonia, unspecified organism: Secondary | ICD-10-CM

## 2011-06-24 MED ORDER — DEXAMETHASONE SODIUM PHOSPHATE 10 MG/ML IJ SOLN
10.0000 mg | Freq: Once | INTRAMUSCULAR | Status: AC
  Administered 2011-06-24: 10 mg via INTRAMUSCULAR

## 2011-06-24 MED ORDER — ALBUTEROL SULFATE (5 MG/ML) 0.5% IN NEBU
2.5000 mg | INHALATION_SOLUTION | Freq: Once | RESPIRATORY_TRACT | Status: AC
  Administered 2011-06-24: 2.5 mg via RESPIRATORY_TRACT

## 2011-06-24 MED ORDER — ALBUTEROL SULFATE HFA 108 (90 BASE) MCG/ACT IN AERS: 2.0000 | INHALATION_SPRAY | Freq: Four times a day (QID) | RESPIRATORY_TRACT | Status: DC | PRN

## 2011-06-24 MED ORDER — IPRATROPIUM BROMIDE 0.02 % IN SOLN
0.5000 mg | Freq: Once | RESPIRATORY_TRACT | Status: AC
  Administered 2011-06-24: 0.5 mg via RESPIRATORY_TRACT

## 2011-06-24 MED ORDER — MOXIFLOXACIN HCL 400 MG PO TABS: 400.0000 mg | ORAL_TABLET | Freq: Every day | ORAL | Status: AC

## 2011-06-24 NOTE — Telephone Encounter (Signed)
lmomtcb  

## 2011-06-24 NOTE — Patient Instructions (Signed)
Good to see you. Please stop taking the amoxicillin. Start taking Avelox and use your inhaler as needed. Call me tomorrow if no improvement.

## 2011-06-24 NOTE — Progress Notes (Signed)
41 yo with h/o HTN and OSA here for worsening URI symptoms.  A couple of weeks of cough, audible wheezing and SOB. Went to UC on Monday, placed on amoxicillin- caused some itching but still took it.  Cough is actually getting worse.  Can hear his wheezing getting louder too. Not given an inhaler at urgent care.  No fever but has had chills.  Patient Active Problem List  Diagnoses  . GOUT  . HYPERTENSION  . GERD  . BACK PAIN  . ABSCESS, SKIN  . Loose stools  . Chest pain  . OSA (obstructive sleep apnea)  . Pneumonia   Past Medical History  Diagnosis Date  . GERD (gastroesophageal reflux disease)   . Gout   . Allergic rhinitis    Past Surgical History  Procedure Date  . Rotator cuff repair     Right  . Arthroscopic repair acl 1999    Left  . Back surgery    History  Substance Use Topics  . Smoking status: Never Smoker   . Smokeless tobacco: Not on file   Comment: smokes an occ cigar very rarely.  . Alcohol Use: Yes   Family History  Problem Relation Age of Onset  . Heart disease Father   . Hyperlipidemia Other   . Hypertension Other   . Breast cancer Mother   . Prostate cancer Father    Allergies  Allergen Reactions  . Ace Inhibitors     REACTION: cough  . Shellfish Allergy    Current Outpatient Prescriptions on File Prior to Visit  Medication Sig Dispense Refill  . colchicine 0.6 MG tablet Take 1 tablet (0.6 mg total) by mouth daily.  90 tablet  1  . EXFORGE 5-160 MG per tablet TAKE 1 TABLET DAILY  90 tablet  2  . tadalafil (CIALIS) 20 MG tablet Take 20 mg by mouth every other day.         The PMH, PSH, Social History, Family History, Medications, and allergies have been reviewed in [facility], and have been updated if relevant.  ROS: See HPI No CP No Rash No LE edema  Physical exam: BP 130/80  Pulse 96  Temp(Src) 98.2 F (36.8 C) (Oral)  Ht 5\' 10"  (1.778 m)  Wt 330 lb 12 oz (150.027 kg)  BMI 47.46 kg/m2 General:  overweght male, appears  fatigued Eyes:  PERRL Ears:  External ear exam shows no significant lesions or deformities.  Otoscopic examination reveals clear canals, tympanic membranes are intact bilaterally without bulging, retraction, inflammation or discharge. Hearing is grossly normal bilaterally. Nose:  External nasal examination shows no deformity or inflammation. Nasal mucosa are pink and moist without lesions or exudates. Mouth:  Oral mucosa and oropharynx without lesions or exudates.  Teeth in good repair. Neck:  no carotid bruit or thyromegaly no cervical or supraclavicular lymphadenopathy  Lungs: bilateral rhonchi, diffuse exp wheezes,  Normal respiratory effort, chest expands symmetrically Heart:  Normal rate and regular rhythm. S1 and S2 normal without gallop, murmur, click, rub or other extra sounds. Pulses:  R and L posterior tibial pulses are full and equal bilaterally  Extremities:  no edema   1. Pneumonia    New.  Will not get CXR today as it will not change treatment. Will d/c amoxicillin- add to allergy list. Start Avelox 400 mg daily. Duonebs given in office today which did improve his lung sounds. Rx for Proair sent in as well. Decadron IM given today. Pt to follow up if no improvement  by tomorrow.

## 2011-06-25 NOTE — Telephone Encounter (Signed)
[email or web address] x1 

## 2011-06-28 NOTE — Telephone Encounter (Signed)
lmomtcb  

## 2011-06-29 NOTE — Telephone Encounter (Signed)
Lmomtcb.  Per protocol, will sign off on this message.  I did leave on vm for pt to call back and leave a new message for the triage nurse if anything further is needed regarding this message.

## 2011-08-02 ENCOUNTER — Other Ambulatory Visit: Payer: Self-pay | Admitting: Family Medicine

## 2011-09-15 ENCOUNTER — Other Ambulatory Visit: Payer: Self-pay | Admitting: *Deleted

## 2011-09-15 MED ORDER — TADALAFIL 20 MG PO TABS: 20.0000 mg | ORAL_TABLET | ORAL | Status: DC

## 2011-09-15 NOTE — Telephone Encounter (Signed)
Called patient on cell phone and it sounded like he picked up but there was a lot of static in the background.  Will call again later.

## 2011-09-15 NOTE — Telephone Encounter (Signed)
Patient is requesting a 90 day supply to be sent to [facility].  Please advise.

## 2011-09-16 NOTE — Telephone Encounter (Signed)
Patient advised as instructed via telephone that Rx was sent to [facility].

## 2011-10-12 ENCOUNTER — Other Ambulatory Visit: Payer: Self-pay | Admitting: Family Medicine

## 2011-10-12 ENCOUNTER — Ambulatory Visit (INDEPENDENT_AMBULATORY_CARE_PROVIDER_SITE_OTHER)
Admission: RE | Admit: 2011-10-12 | Discharge: 2011-10-12 | Disposition: A | Discharge status: Home / Self Care | Payer: BC Managed Care – PPO | Source: Ambulatory Visit | Attending: Family Medicine | Admitting: Family Medicine

## 2011-10-12 ENCOUNTER — Ambulatory Visit (INDEPENDENT_AMBULATORY_CARE_PROVIDER_SITE_OTHER): Payer: BC Managed Care – PPO | Admitting: Family Medicine

## 2011-10-12 ENCOUNTER — Encounter: Payer: Self-pay | Admitting: Family Medicine

## 2011-10-12 VITALS — BP 140/96 | HR 88 | Temp 98.2°F | Wt 326.0 lb

## 2011-10-12 DIAGNOSIS — R350 Frequency of micturition: Secondary | ICD-10-CM

## 2011-10-12 DIAGNOSIS — R319 Hematuria, unspecified: Secondary | ICD-10-CM | POA: Insufficient documentation

## 2011-10-12 DIAGNOSIS — N200 Calculus of kidney: Secondary | ICD-10-CM | POA: Insufficient documentation

## 2011-10-12 LAB — POCT URINALYSIS DIPSTICK
Leukocytes, UA: NEGATIVE
Nitrite, UA: NEGATIVE
Urobilinogen, UA: 0.2
pH, UA: 6

## 2011-10-12 MED ORDER — OXYCODONE-ACETAMINOPHEN 5-325 MG PO TABS: 1.0000 | ORAL_TABLET | Freq: Three times a day (TID) | ORAL | Status: AC | PRN

## 2011-10-12 NOTE — Progress Notes (Signed)
SUBJECTIVE: Jerry Carpenter is a 41 y.o. male who complains of urinary frequency and  urgency for past two days.  No dysuria. No fevers. No hematuria. No back pain. No n/v/d.  Patient Active Problem List  Diagnoses  . GOUT  . HYPERTENSION  . GERD  . BACK PAIN  . ABSCESS, SKIN  . Loose stools  . Chest pain  . OSA (obstructive sleep apnea)  . Pneumonia   Past Medical History  Diagnosis Date  . GERD (gastroesophageal reflux disease)   . Gout   . Allergic rhinitis    Past Surgical History  Procedure Date  . Rotator cuff repair     Right  . Arthroscopic repair acl 1999    Left  . Back surgery    History  Substance Use Topics  . Smoking status: Never Smoker   . Smokeless tobacco: Not on file   Comment: smokes an occ cigar very rarely.  . Alcohol Use: Yes   Family History  Problem Relation Age of Onset  . Heart disease Father   . Hyperlipidemia Other   . Hypertension Other   . Breast cancer Mother   . Prostate cancer Father    Allergies  Allergen Reactions  . Ace Inhibitors     REACTION: cough  . Amoxicillin Itching  . Shellfish Allergy    Current Outpatient Prescriptions on File Prior to Visit  Medication Sig Dispense Refill  . albuterol (PROVENTIL HFA;VENTOLIN HFA) 108 (90 BASE) MCG/ACT inhaler Inhale 2 puffs into the lungs every 6 (six) hours as needed for wheezing.  1 Inhaler  0  . colchicine 0.6 MG tablet TAKE 1 TABLET (0.6 MG TOTAL) DAILY  90 tablet  3  . EXFORGE 5-160 MG per tablet TAKE 1 TABLET DAILY  90 tablet  2  . tadalafil (CIALIS) 20 MG tablet Take 1 tablet (20 mg total) by mouth every other day.  45 tablet  3   The PMH, PSH, Social History, Family History, Medications, and allergies have been reviewed in [facility], and have been updated if relevant.  OBJECTIVE:  BP 140/96  Pulse 88  Temp(Src) 98.2 F (36.8 C) (Oral)  Wt 326 lb (147.873 kg)  Appears well, in no apparent distress.  Vital signs are normal. The abdomen is soft without tenderness,  guarding, mass, rebound or organomegaly. No CVA tenderness or inguinal adenopathy noted. Urine dipstick shows positive for RBC's.    Assessment and Plan: 1. Urine frequency  POCT urinalysis dipstick, CT Abdomen Pelvis Wo Contrast   New with hematuria on UA. UA otherwise neg. ?nephrolithaisis. Will get stat non contrast CT of abdomen and pelvis. Percocet rx given if pain develops. The patient indicates understanding of these issues and agrees with the plan.

## 2011-10-12 NOTE — Progress Notes (Signed)
Addended by: Ladell Lea C on: 08/21/2012 09:57 AM   Modules accepted: Orders  

## 2011-10-12 NOTE — Patient Instructions (Signed)
Good to see you. Please stop by to see Jerry Carpenter on your way out. 

## 2011-10-14 LAB — URINE CULTURE: Colony Count: NO GROWTH

## 2011-10-15 NOTE — Progress Notes (Signed)
Patient called back to let Dr. Dayton Martes know that he has not passed the stone, during the day he feels ok and doesn't have much pressure but at night he is having pressure and discomfort and he feels that "there is something coming down the pike."  Please advise.

## 2011-11-29 ENCOUNTER — Ambulatory Visit (INDEPENDENT_AMBULATORY_CARE_PROVIDER_SITE_OTHER): Payer: BC Managed Care – PPO | Admitting: Pulmonary Disease

## 2011-11-29 ENCOUNTER — Encounter: Payer: Self-pay | Admitting: Pulmonary Disease

## 2011-11-29 ENCOUNTER — Other Ambulatory Visit: Payer: Self-pay | Admitting: Pulmonary Disease

## 2011-11-29 VITALS — BP 140/88 | HR 80 | Temp 97.7°F | Ht 70.0 in | Wt 329.8 lb

## 2011-11-29 DIAGNOSIS — G4733 Obstructive sleep apnea (adult) (pediatric): Secondary | ICD-10-CM

## 2011-11-29 NOTE — Progress Notes (Signed)
  Subjective:    Patient ID: Jerry Carpenter, male    DOB: 04-Mar-1971, 41 y.o.   MRN: [id number]  HPI The patient comes in today for followup of his severe obstructive sleep apnea.  He is wearing CPAP compliantly, but we have never received his download on the auto setting from last visit.  He is very unhappy with his current DME, and is considering changing.  The patient feels that he is sleeping much better with the device, and has improved daytime alertness.  He feels the mask is fitting well, however is having issues with nasal congestion and sore throat in the mornings.  He has a heated humidifier returned, and is using a lot of water.  He thinks a lot of this is nasal congestion with postnasal drip.   Review of Systems  Constitutional: Negative for fever and unexpected weight change.  HENT: Positive for sore throat and rhinorrhea. Negative for ear pain, nosebleeds, congestion, sneezing, trouble swallowing, dental problem, postnasal drip and sinus pressure.   Eyes: Negative for redness and itching.  Respiratory: Negative for cough, chest tightness, shortness of breath and wheezing.   Cardiovascular: Positive for leg swelling. Negative for palpitations.  Gastrointestinal: Negative for nausea and vomiting.  Genitourinary: Negative for dysuria.  Musculoskeletal: Negative for joint swelling.  Skin: Negative for rash.  Neurological: Positive for headaches.  Hematological: Does not bruise/bleed easily.  Psychiatric/Behavioral: Negative for dysphoric mood. The patient is not nervous/anxious.        Objective:   Physical Exam Obese male in no acute distress Nose without purulence or discharge noted.  Deviated septum to the left with near obstruction. No skin breakdown or pressure necrosis from the CPAP mask Lower extremities without edema, cyanosis Alert and oriented, moves all 4 extremities.       Assessment & Plan:

## 2011-11-29 NOTE — Assessment & Plan Note (Signed)
The patient is wearing CPAP compliant, and has seen improvement in his sleep.  However, we have never received his download from his DME, and we need to get this in order to optimize his pressure.  I have also stressed to patient importance of weight loss.  Currently, he is having a lot of issues with nasal congestion, and it is unclear how much of this could be allergic rhinitis versus anatomical obstruction.  Will try him on a topical nasal steroid, but if he continues to have issues he would like to consider ENT evaluation for surgery.

## 2011-11-29 NOTE — Patient Instructions (Signed)
Will call to see if they have your auto download from months ago.  Will let you know your optimal pressure. Work on weight loss. Will try nasonex 2 sprays each nostril each am for next few weeks to see if your nasal obstruction improves.  If not, can refer you to ENT for evaluation/? Surgery. If doing well with cpap, followup with me in 12mos.

## 2012-02-24 ENCOUNTER — Other Ambulatory Visit: Payer: Self-pay | Admitting: Family Medicine

## 2012-05-16 ENCOUNTER — Other Ambulatory Visit: Payer: Self-pay

## 2012-05-16 NOTE — Telephone Encounter (Signed)
Pt left v/m requesting refill on exforge. Pt should have refill with [facility]. Spoke with Joe pts brother and he will ck with [facility] about refill

## 2012-05-29 ENCOUNTER — Encounter: Payer: Self-pay | Admitting: Family Medicine

## 2012-05-29 ENCOUNTER — Other Ambulatory Visit: Payer: Self-pay | Admitting: Family Medicine

## 2012-05-29 ENCOUNTER — Ambulatory Visit (INDEPENDENT_AMBULATORY_CARE_PROVIDER_SITE_OTHER): Payer: BC Managed Care – PPO | Admitting: Family Medicine

## 2012-05-29 VITALS — BP 120/76 | HR 76 | Temp 97.5°F | Wt 284.0 lb

## 2012-05-29 DIAGNOSIS — E119 Type 2 diabetes mellitus without complications: Secondary | ICD-10-CM | POA: Insufficient documentation

## 2012-05-29 DIAGNOSIS — H538 Other visual disturbances: Secondary | ICD-10-CM

## 2012-05-29 DIAGNOSIS — Z23 Encounter for immunization: Secondary | ICD-10-CM

## 2012-05-29 LAB — CBC WITH DIFFERENTIAL/PLATELET
Hemoglobin: 15 g/dL (ref 13.0–17.0)
Lymphs Abs: 2.8 10*3/uL (ref 0.7–4.0)
MCV: 86.7 fl (ref 78.0–100.0)
Monocytes Absolute: 0.5 10*3/uL (ref 0.1–1.0)
Neutro Abs: 4.2 10*3/uL (ref 1.4–7.7)
RBC: 5.25 Mil/uL (ref 4.22–5.81)
RDW: 13.3 % (ref 11.5–14.6)

## 2012-05-29 LAB — COMPREHENSIVE METABOLIC PANEL
ALT: 35 U/L (ref 0–53)
Albumin: 3.9 g/dL (ref 3.5–5.2)
BUN: 13 mg/dL (ref 6–23)
Calcium: 9.3 mg/dL (ref 8.4–10.5)
GFR: 83.02 mL/min (ref >60.00)
Glucose, Bld: 334 mg/dL — ABNORMAL HIGH (ref 70–99)
Potassium: 3.9 mEq/L (ref 3.5–5.1)
Total Protein: 7.5 g/dL (ref 6.0–8.3)

## 2012-05-29 LAB — TSH: TSH: 4.42 u[IU]/mL (ref 0.35–5.50)

## 2012-05-29 MED ORDER — AMLODIPINE BESYLATE-VALSARTAN 5-160 MG PO TABS: ORAL_TABLET | ORAL | Status: DC

## 2012-05-29 MED ORDER — METFORMIN HCL 500 MG PO TABS: 500.0000 mg | ORAL_TABLET | Freq: Two times a day (BID) | ORAL | Status: DC

## 2012-05-29 MED ORDER — ONETOUCH BASIC SYSTEM W/DEVICE KIT: PACK | Status: AC

## 2012-05-29 MED ORDER — AMLODIPINE BESYLATE-VALSARTAN 5-160 MG PO TABS: 1.0000 | ORAL_TABLET | Freq: Every day | ORAL | Status: DC

## 2012-05-29 NOTE — Patient Instructions (Addendum)
We will call you with your labs results. Please make an appointment with your eye doctor today.

## 2012-05-29 NOTE — Progress Notes (Signed)
Subjective:    Patient ID: Jerry Carpenter, male    DOB: [birth year], 41 y.o.   MRN: [id number]  HPI  41 yo male with h/o HTN here for blurred vision.  Started on Monday- acutely.  Could not see anything up close- could drive and see things far away although a little blurry. Symptoms were bilaterally. No eye pain, no photophobia, no floaters. No known eye injury.  Symptoms resolved spontaneously on Friday (5 days later).  No other associated symptoms- denies any HA, SOB, CP, facial droop, difficulty walking, weakness in his extremities.  Contact Rx was changed in April.  He has been working hard to loose weight.  Has lost 45 pounds with diet and exercise. Was out in the heat working hard for several days prior to this acute episode. Wt Readings from Last 3 Encounters:  05/29/12 284 lb (128.822 kg)  11/29/11 329 lb 12.8 oz (149.596 kg)  10/12/11 326 lb (147.873 kg)   Patient Active Problem List  Diagnosis  . GOUT  . HYPERTENSION  . GERD  . BACK PAIN  . ABSCESS, SKIN  . Loose stools  . Chest pain  . OSA (obstructive sleep apnea)  . Pneumonia  . Hematuria  . Kidney stones  . Blurred vision   Past Medical History  Diagnosis Date  . GERD (gastroesophageal reflux disease)   . Gout   . Allergic rhinitis    Past Surgical History  Procedure Date  . Rotator cuff repair     Right  . Arthroscopic repair acl 1999    Left  . Back surgery    History  Substance Use Topics  . Smoking status: Never Smoker   . Smokeless tobacco: Never Used   Comment: smokes an occ cigar very rarely.  . Alcohol Use: Yes   Family History  Problem Relation Age of Onset  . Heart disease Father   . Hyperlipidemia Other   . Hypertension Other   . Breast cancer Mother   . Prostate cancer Father    Allergies  Allergen Reactions  . Ace Inhibitors     REACTION: cough  . Amoxicillin Itching  . Shellfish Allergy    Current Outpatient Prescriptions on File Prior to Visit  Medication Sig  Dispense Refill  . albuterol (PROVENTIL HFA;VENTOLIN HFA) 108 (90 BASE) MCG/ACT inhaler Inhale 2 puffs into the lungs every 6 (six) hours as needed for wheezing.  1 Inhaler  0  . colchicine 0.6 MG tablet TAKE 1 TABLET (0.6 MG TOTAL) DAILY  90 tablet  3  . EXFORGE 5-160 MG per tablet TAKE 1 TABLET DAILY  90 tablet  1  . tadalafil (CIALIS) 20 MG tablet Take 1 tablet (20 mg total) by mouth every other day.  45 tablet  3   The PMH, PSH, Social History, Family History, Medications, and allergies have been reviewed in [facility], and have been updated if relevant.    Review of Systems See HPI No increased thirst or urniary No nausea, vomiting or diarrhea No fatigue    Objective:   Physical Exam BP 120/76  Pulse 76  Temp 97.5 F (36.4 C)  Wt 284 lb (128.822 kg) Wt Readings from Last 3 Encounters:  05/29/12 284 lb (128.822 kg)  11/29/11 329 lb 12.8 oz (149.596 kg)  10/12/11 326 lb (147.873 kg)   Gen:  Alert, pleasant male, NAD HEENT: PERRL Neck:  No bruits Resp:  CTA bilaterally CVS:  RRR Ext:  No edema Neuro:  CN II- XII in tact, no  facial droop, strength equal in all four extremities, reflexes normal and symmetrical throughout.     Assessment & Plan:   1. Blurred vision  Comprehensive metabolic panel, CBC with Differential, TSH   New- resolved. Needs a dilated eye exam ASAP- he is calling his eye doctor while in office to schedule an appointment. Does not seem consistent with CVA or TIA. Will order labs today. Pt to call immediately if symptoms return. If eye exam normal and symptoms recurr, consider TIA work up- carotid dopplers, etc.

## 2012-05-29 NOTE — Addendum Note (Signed)
Addended by: Eliezer Bottom on: 05/29/2012 11:28 AM   Modules accepted: Orders

## 2012-05-30 ENCOUNTER — Other Ambulatory Visit (INDEPENDENT_AMBULATORY_CARE_PROVIDER_SITE_OTHER): Payer: BC Managed Care – PPO

## 2012-05-30 DIAGNOSIS — E119 Type 2 diabetes mellitus without complications: Secondary | ICD-10-CM

## 2012-05-30 LAB — HEMOGLOBIN A1C: Hgb A1c MFr Bld: 13.6 % — ABNORMAL HIGH (ref 4.6–6.5)

## 2012-06-12 ENCOUNTER — Ambulatory Visit (INDEPENDENT_AMBULATORY_CARE_PROVIDER_SITE_OTHER): Payer: BC Managed Care – PPO | Admitting: Family Medicine

## 2012-06-12 ENCOUNTER — Encounter: Payer: Self-pay | Admitting: Family Medicine

## 2012-06-12 VITALS — BP 116/80 | HR 64 | Temp 97.5°F | Wt 287.0 lb

## 2012-06-12 DIAGNOSIS — E119 Type 2 diabetes mellitus without complications: Secondary | ICD-10-CM

## 2012-06-12 MED ORDER — METFORMIN HCL 1000 MG PO TABS: 500.0000 mg | ORAL_TABLET | Freq: Two times a day (BID) | ORAL | Status: DC

## 2012-06-12 NOTE — Progress Notes (Signed)
Subjective:    Patient ID: Jerry Carpenter, male    DOB: February 21, 1971, 41 y.o.   MRN: [id number]  HPI  41 yo male here for follow up new onset DM.  Saw him on 9/16 for blurred vision.  A1c was markedly elevated.  Lab Results  Component Value Date   HGBA1C 13.6* 05/30/2012   Started him on Metformin 500 mg .twice daily. He unfortunately has not yet picked up his glucometer or attended diabetes education classes.  He does feel better.  No recurrent blurred vision.  Tolerating Metformin ok- no abdominal pain, nausea or diarrhea.  He admits to still eating breads, pastas and ensure shakes.   Still wants to continue to lose weight.  Wt Readings from Last 3 Encounters:  06/12/12 287 lb (130.182 kg)  05/29/12 284 lb (128.822 kg)  11/29/11 329 lb 12.8 oz (149.596 kg)   Patient Active Problem List  Diagnosis  . GOUT  . HYPERTENSION  . GERD  . BACK PAIN  . ABSCESS, SKIN  . Loose stools  . Chest pain  . OSA (obstructive sleep apnea)  . Pneumonia  . Hematuria  . Kidney stones  . Blurred vision  . Diabetes mellitus, new onset   Past Medical History  Diagnosis Date  . GERD (gastroesophageal reflux disease)   . Gout   . Allergic rhinitis    Past Surgical History  Procedure Date  . Rotator cuff repair     Right  . Arthroscopic repair acl 1999    Left  . Back surgery    History  Substance Use Topics  . Smoking status: Never Smoker   . Smokeless tobacco: Never Used   Comment: smokes an occ cigar very rarely.  . Alcohol Use: Yes   Family History  Problem Relation Age of Onset  . Heart disease Father   . Hyperlipidemia Other   . Hypertension Other   . Breast cancer Mother   . Prostate cancer Father    Allergies  Allergen Reactions  . Ace Inhibitors     REACTION: cough  . Amoxicillin Itching  . Shellfish Allergy    Current Outpatient Prescriptions on File Prior to Visit  Medication Sig Dispense Refill  . albuterol (PROVENTIL HFA;VENTOLIN HFA) 108 (90 BASE) MCG/ACT  inhaler Inhale 2 puffs into the lungs every 6 (six) hours as needed for wheezing.  1 Inhaler  0  . amLODipine-valsartan (EXFORGE) 5-160 MG per tablet Take 1 tablet by mouth daily.  30 tablet  0  . Blood Glucose Monitoring Suppl (ONE TOUCH BASIC SYSTEM) W/DEVICE KIT Please check blood sugar three times daily x 1 week, keep log, then check three times weekly.  1 each  0  . colchicine 0.6 MG tablet TAKE 1 TABLET (0.6 MG TOTAL) DAILY  90 tablet  3  . metFORMIN (GLUCOPHAGE) 500 MG tablet Take 1 tablet (500 mg total) by mouth 2 (two) times daily with a meal.  180 tablet  3  . tadalafil (CIALIS) 20 MG tablet Take 1 tablet (20 mg total) by mouth every other day.  45 tablet  3  . DISCONTD: amLODipine-valsartan (EXFORGE) 5-160 MG per tablet Take one tablet by mouth daily  90 tablet  3   The PMH, PSH, Social History, Family History, Medications, and allergies have been reviewed in [facility], and have been updated if relevant.    Review of Systems See HPI No increased thirst or urniary No nausea, vomiting or diarrhea No fatigue    Objective:   Physical Exam  BP 116/80  Pulse 64  Temp 97.5 F (36.4 C)  Wt 287 lb (130.182 kg) Wt Readings from Last 3 Encounters:  06/12/12 287 lb (130.182 kg)  05/29/12 284 lb (128.822 kg)  11/29/11 329 lb 12.8 oz (149.596 kg)   Gen:  Alert, pleasant male, NAD HEENT: PERRL Neck:  No bruits Resp:  CTA bilaterally CVS:  RRR Ext:  No edema Neuro:  CN II- XII in tact, no facial droop, strength equal in all four extremities, reflexes normal and symmetrical throughout.     Assessment & Plan:   1. Diabetes mellitus, new onset   CBG 242 today. Will increase Metformin to 1000 mg twice daily. Given eat right diet handout and discussed more diabetic friendly choices. Discussed importance of attending his diabetic teaching class next week and picking up his meter. He will follow up with me in 2 months. The patient indicates understanding of these issues and agrees with  the plan.

## 2012-06-12 NOTE — Patient Instructions (Addendum)
Look for a supplement shake for diabetics- low sugar- you can find these at most grocery stores. Please make sure you go to your diabetes education class. Please come see me in 2 months.

## 2012-06-14 IMAGING — CT CT ABD-PELV W/O CM
2 of 4 series · 17 of 46 positions shown, 19 images · non-contrast
Comparison: None.

CLINICAL DATA: New onset urinary frequency and microscopic
hematuria.  No history of malignancy.

CT ABDOMEN AND PELVIS WITHOUT CONTRAST
TECHNIQUE: Multidetector CT imaging of the abdomen and pelvis was
performed following the standard protocol without intravenous
contrast.

[Series 2: ap stone study · axial · 0.84mm/px · z∈[-596,-106]mm · 14 of 108 slices shown, 16 images]
[im 5/108  soft-tissue]
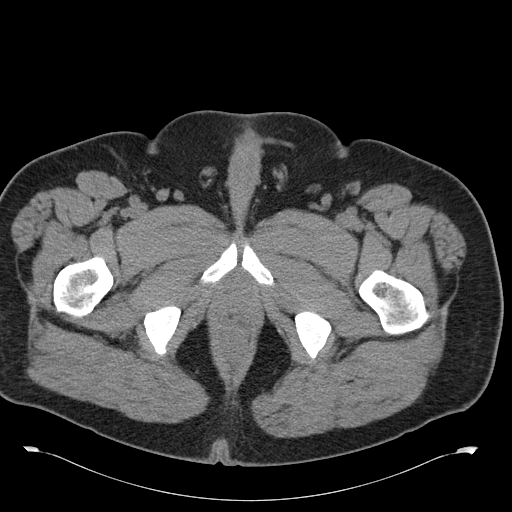
[im 5/108  bone]
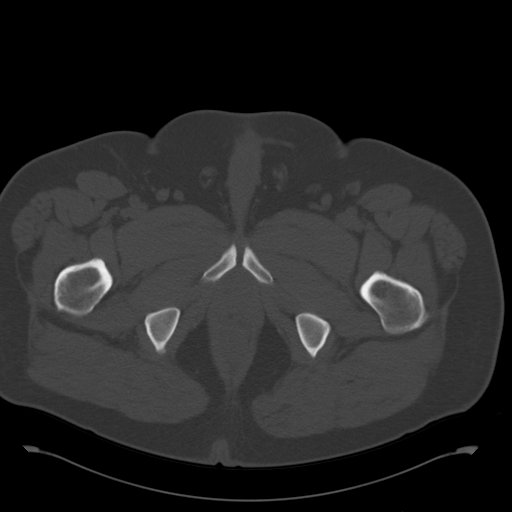
[im 13/108  soft-tissue]
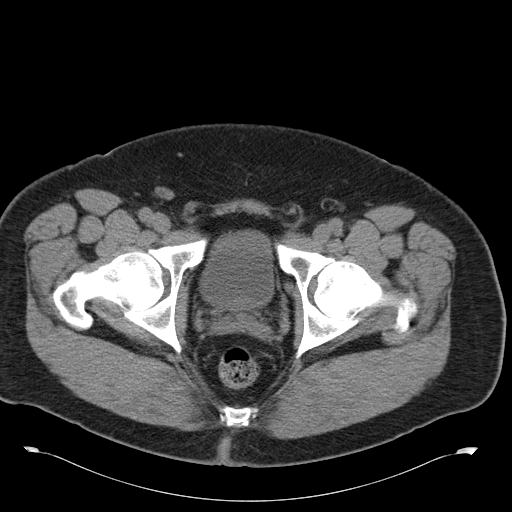
[im 22/108  soft-tissue]
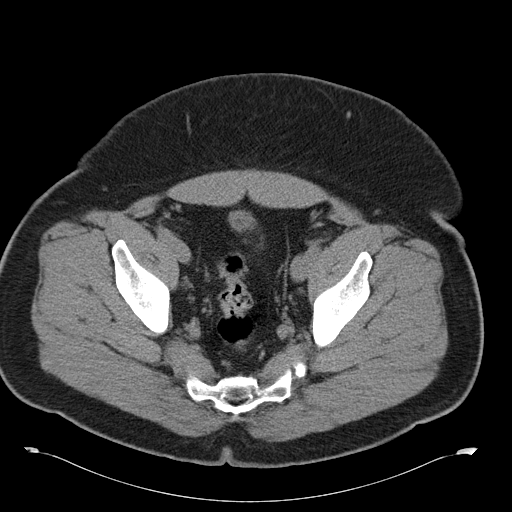
[im 30/108  soft-tissue]
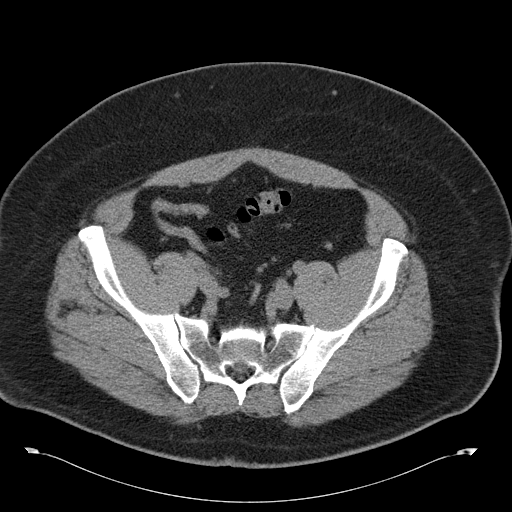
[im 35/108  soft-tissue]
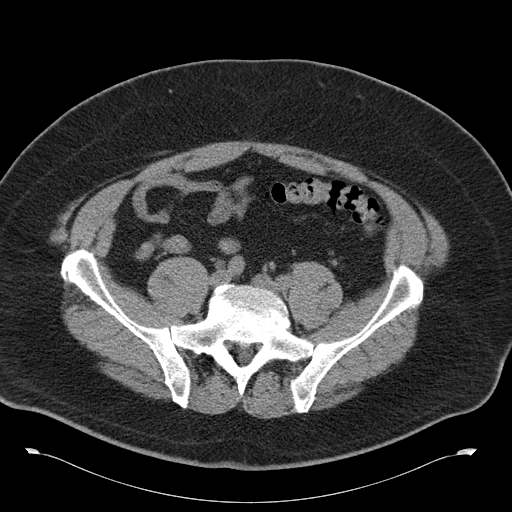
[im 43/108  soft-tissue]
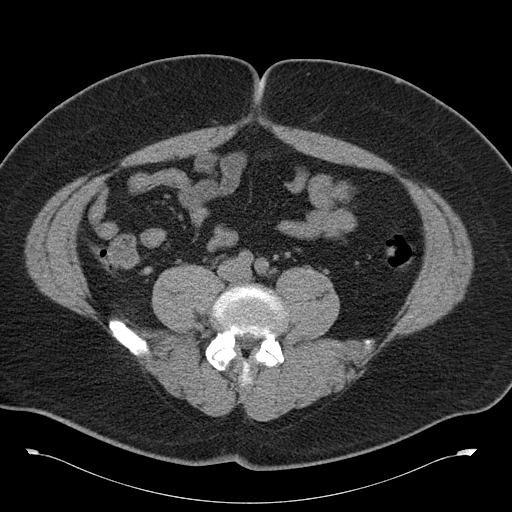
[im 52/108  soft-tissue]
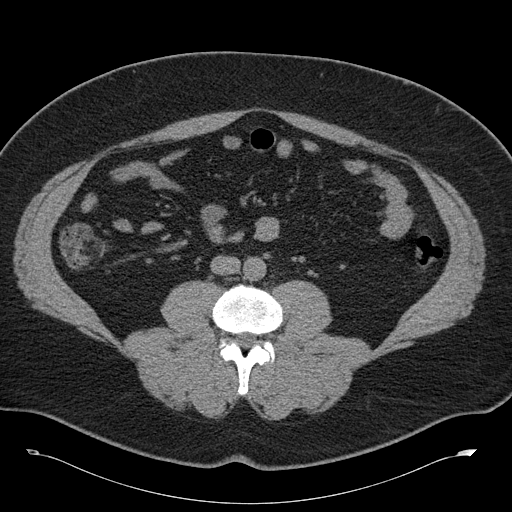
[im 56/108  soft-tissue]
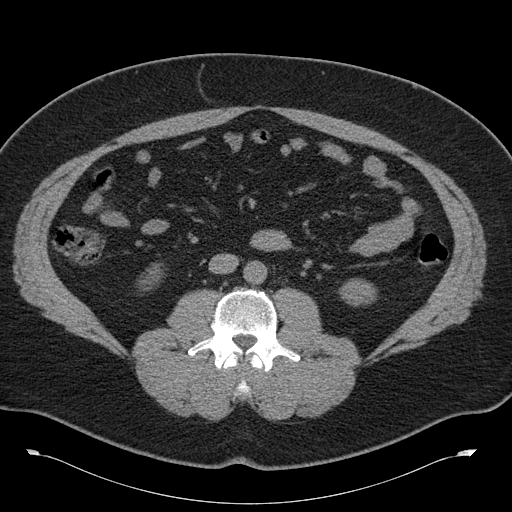
[im 65/108  soft-tissue]
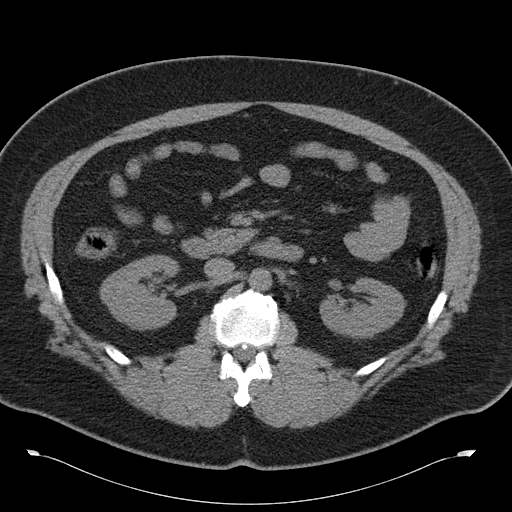
[im 65/108  bone]
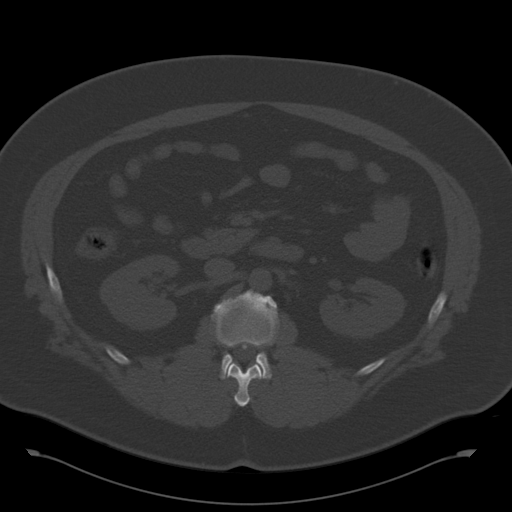
[im 73/108  soft-tissue]
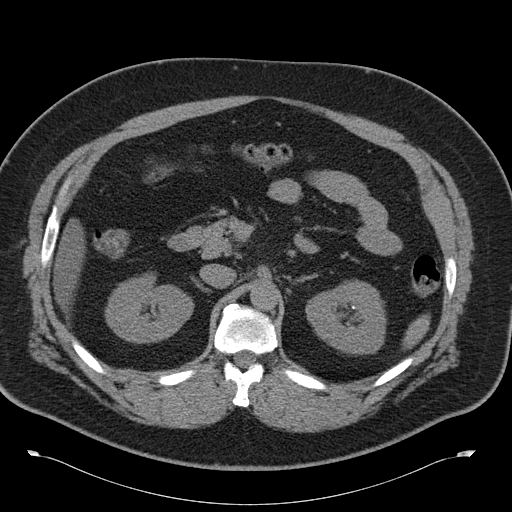
[im 82/108  soft-tissue]
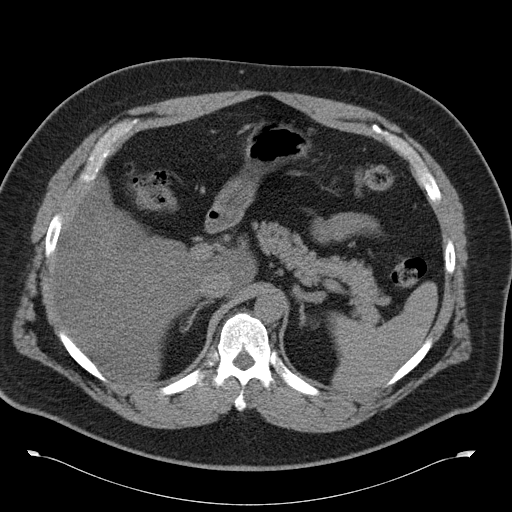
[im 86/108  soft-tissue]
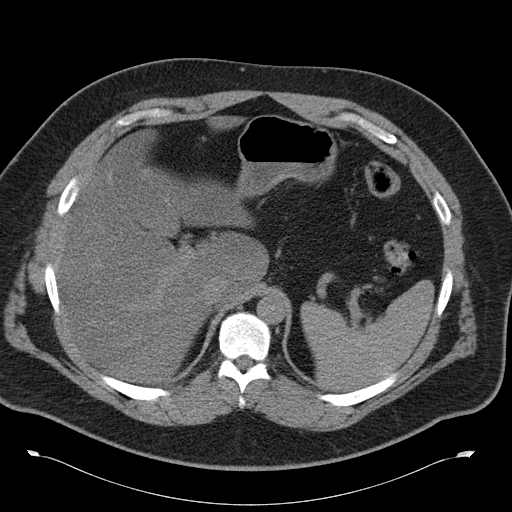
[im 95/108  soft-tissue]
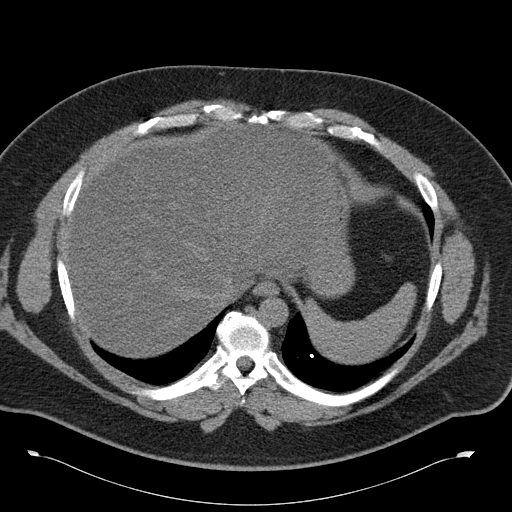
[im 103/108  soft-tissue]
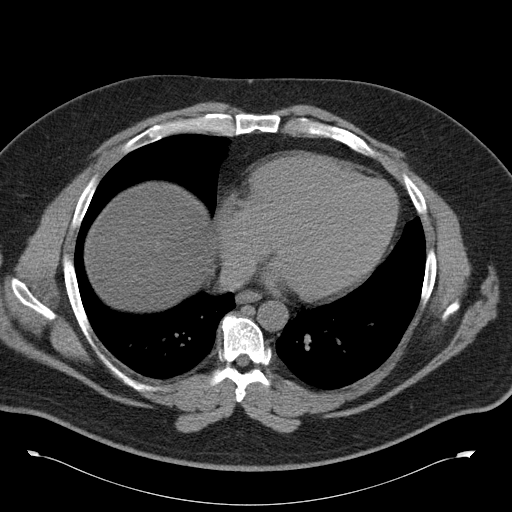

[Series 602: cor · coronal · 1.09mm/px · 3 of 125 slices shown]
[im 42/125  soft-tissue]
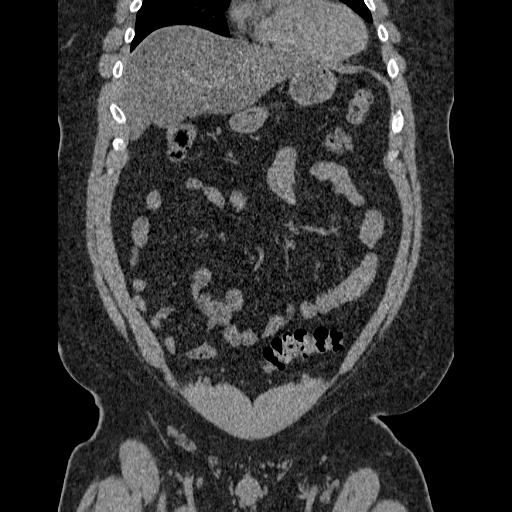
[im 56/125  soft-tissue]
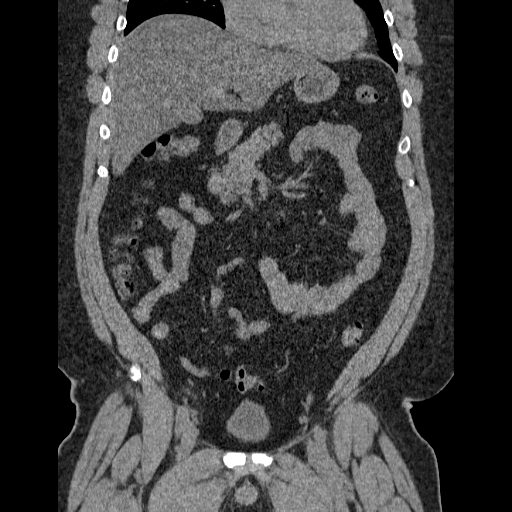
[im 69/125  soft-tissue]
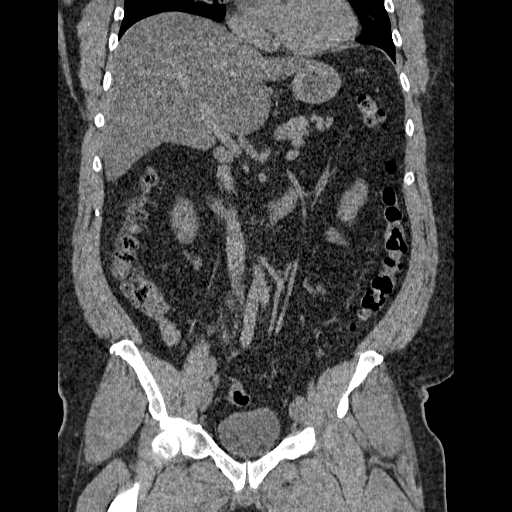

[17 of 46 positions shown; findings below may reference images not displayed]

FINDINGS: The lung bases are clear aside from a small calcified
granuloma in the left lower lobe on image 14.  There is no pleural
effusion.

There is a 2 mm nonobstructing calculus in the upper pole of the
left kidney on image 37.  No other renal calculi are demonstrated.
There is no perinephric soft tissue stranding or significant
ureteral dilatation.  However, there is a 3 mm calculus in the
bladder lumen on image 94, near the left ureteral vesicle junction.
Both kidneys otherwise appear unremarkable as imaged in the
noncontrast state.

The liver demonstrates diffuse severe steatosis.  No focal lesions
are identified.  The spleen, gallbladder, pancreas and adrenal
glands appear normal.  Mildly prominent portacaval lymph nodes are
not pathologically enlarged.  No bowel lesions are seen.  There is
no lymphadenopathy or inflammatory process.  The appendix appears
normal.  There are small central prostatic calcifications.

Degenerative changes are noted throughout the spine, most advanced
at L5 - S1 where there is moderate biforaminal stenosis.
IMPRESSION: 1.  Nonobstructing calculus in the upper pole of the left kidney.
2.  3 mm calculus near the left ureteral vesicle junction may have
already passed into the bladder.  There is no significant
associated ureteral dilatation. Clinical follow-up recommended.
3.  Diffuse hepatic steatosis.
4.  Biforaminal stenosis at L5-S1 by osteophytes.

## 2012-06-19 ENCOUNTER — Encounter: Payer: BC Managed Care – PPO | Attending: Family Medicine | Admitting: *Deleted

## 2012-06-19 ENCOUNTER — Encounter: Payer: Self-pay | Admitting: *Deleted

## 2012-06-19 VITALS — Ht 70.0 in | Wt 285.6 lb

## 2012-06-19 DIAGNOSIS — E119 Type 2 diabetes mellitus without complications: Secondary | ICD-10-CM | POA: Insufficient documentation

## 2012-06-19 DIAGNOSIS — Z713 Dietary counseling and surveillance: Secondary | ICD-10-CM | POA: Insufficient documentation

## 2012-06-19 NOTE — Patient Instructions (Signed)
Goals: 1. 3-4 carbohydrate servings (45-60 grams) at meals.  2. Monitor portion size of both carbohydrate and non-carbohydrate containing foods for blood glucose control and weight management.  3. Patient may continue Ensure shakes BID as meal replacements as a weight loss aid (40 g carbs per serving).

## 2012-06-19 NOTE — Progress Notes (Signed)
Medical Nutrition Therapy:  Appt start time: 0915 end time:  1015.  Assessment:  Primary concerns today: Type 2 Diabetes. Patient recently diagnosed with type 2 diabetes. HgbA1c 13.6. He has been trying to lose weight, and has lost 55 pounds since June by drinking Ensure Shakes for breakfast and lunch. Since diagnosis, he has cut out many carbohydrate containing foods. He does not check his blood glucose, but is picking up a meter soon.   WEIGHT: 285.6 pounds BMI: 41.1  MEDICATIONS: Metformin, Exforge,    DIETARY INTAKE:   Usual eating pattern includes 2-3 meals and 0 snacks per day.  24-hr recall:  B ( AM): Skips usually or Ensure  Snk ( AM): None  L ( PM): Ensure or leftovers Snk ( PM): None D ( PM): Meat, vegetable (lima beans, potatoes, green beans, salad, corn, pintos) Snk ( PM): None Beverages: Water  Usual physical activity: Walking 2x/week 30-60 minutes  Estimated energy needs: 2000 calories 225 g carbohydrates 150 g protein 56 g fat  Progress Towards Goal(s):  In progress.   Nutritional Diagnosis:  NB-1.1 Food and nutrition-related knowledge deficit As related to type 2 diabetes.  As evidenced by no need for prior education.    Intervention:  Nutrition counseling. We discussed basic carb counting, including foods with carbs, label reading, portion size, and meal planning.   Goals: 1. 3-4 carbohydrate servings (45-60 grams) at meals.  2. Monitor portion size of both carbohydrate and non-carbohydrate containing foods for blood glucose control and weight management.  3. Patient may continue Ensure shakes BID as meal replacements as a weight loss aid (40 g carbs per serving).   Handouts given during visit include:  Carbohydrate counting booklet  Yellow portion card  Monitoring/Evaluation:  Dietary intake, exercise, blood glucose, and body weight in 1 month(s).

## 2012-06-28 ENCOUNTER — Other Ambulatory Visit: Payer: Self-pay | Admitting: *Deleted

## 2012-06-28 MED ORDER — GLUCOSE BLOOD VI STRP: ORAL_STRIP | Status: AC

## 2012-06-28 MED ORDER — GLUCOSE BLOOD VI STRP: ORAL_STRIP | Status: DC

## 2012-06-28 NOTE — Telephone Encounter (Signed)
Patient came by the office requesting rx for test strips for the new One touch ultra mini glucometer that he just got. Patient requested that a script be sent to [facility] and [address].

## 2012-06-28 NOTE — Telephone Encounter (Signed)
Scipts sent electronically as requested.

## 2012-06-29 ENCOUNTER — Other Ambulatory Visit: Payer: Self-pay | Admitting: *Deleted

## 2012-06-29 MED ORDER — METFORMIN HCL 1000 MG PO TABS: 500.0000 mg | ORAL_TABLET | Freq: Two times a day (BID) | ORAL | Status: DC

## 2012-07-24 ENCOUNTER — Ambulatory Visit: Payer: BC Managed Care – PPO | Admitting: *Deleted

## 2012-07-24 ENCOUNTER — Other Ambulatory Visit: Payer: Self-pay | Admitting: Family Medicine

## 2012-07-24 NOTE — Telephone Encounter (Signed)
Received refill request electronically from pharmacy. Last office visit 06/12/12. See allergy/contraindication. Is it okay to refill medication?

## 2012-08-07 ENCOUNTER — Encounter: Payer: Self-pay | Admitting: *Deleted

## 2012-08-07 ENCOUNTER — Encounter: Payer: BC Managed Care – PPO | Attending: Family Medicine | Admitting: *Deleted

## 2012-08-07 VITALS — Ht 70.0 in | Wt 290.0 lb

## 2012-08-07 DIAGNOSIS — Z713 Dietary counseling and surveillance: Secondary | ICD-10-CM | POA: Insufficient documentation

## 2012-08-07 DIAGNOSIS — E119 Type 2 diabetes mellitus without complications: Secondary | ICD-10-CM | POA: Insufficient documentation

## 2012-08-07 NOTE — Progress Notes (Signed)
Medical Nutrition Therapy:  Appt start time: 0930 end time:  1000.  Assessment:  Patient reports that he has gotten off track with his diet plan over the last few weeks. His meal intake is fairly consistent. He reports changing to a lower calorie/lower carb meal replacement shake. However, he reports drinking more sweet tea and regular soda lately. He continues to exercise regularly. His weight is 290 pounds, which is up from 285 pounds last month. Since the last visit, he has gotten a glucose meter. He tries to check his blood glucose every morning, but does not always check. Average readings range from 118-130. He has been reading food labels and verbalizes understanding of what foods have carbohydrates.   MEDICATIONS: Metformin, Exforge   DIETARY INTAKE:   Usual eating pattern includes 3 meals and 0-1 snacks per day.  24-hr recall:  B ( AM): Special K shake, banana/apple, water  Snk ( AM): None  L ( PM): Special K shake or fruit, sometimes skips Snk ( PM): Fruit, Kind bar D ( PM): Grilled chicken with olive oil/seasoning, white wheat bun, or beef/chicken stew Snk ( PM): None Beverages: Water, coffee, shakes, sometimes sweet tea/soda  Usual physical activity: Walking 20-30 min/day  Estimated energy needs: 2000 calories 225 g carbohydrates 150 g protein 56 g fat  Progress Towards Goal(s):  Some progress.   Nutritional Diagnosis:  NB-1.1 Food and nutrition-related knowledge deficit As related to type 2 diabetes. As evidenced by no need for prior education.    Intervention:  Nutrition counseling. I reinforced the concept of carbohydrate counting. We discussed the number of carbohydrates to have at each meal, and how to fit in healthy carbohydrates vs. unhealthy carbs.   Goals:  1. 3-4 carbohydrate servings (45-60 grams) at meals.  2. Monitor portion size of both carbohydrate and non-carbohydrate containing foods for blood glucose control and weight management.  3. Patient may  continue meal replacement shakes BID as meal replacements as a weight loss aid (28 g carbs per serving).  4. Limit sweetened drinks in the diet (sweet tea, soda).   Monitoring/Evaluation:  Dietary intake, exercise, blood glucose, and body weight in 2 month(s).

## 2012-08-07 NOTE — Patient Instructions (Signed)
Goals:  1. 3-4 carbohydrate servings (45-60 grams) at meals.  2. Monitor portion size of both carbohydrate and non-carbohydrate containing foods for blood glucose control and weight management.  3. Patient may continue meal replacement shakes BID as meal replacements as a weight loss aid (28 g carbs per serving).  4. Limit sweetened drinks in the diet (sweet tea, soda).

## 2012-08-22 ENCOUNTER — Telehealth: Payer: Self-pay

## 2012-08-22 MED ORDER — METFORMIN HCL 1000 MG PO TABS: ORAL_TABLET | ORAL | Status: DC

## 2012-08-22 NOTE — Telephone Encounter (Signed)
Left message asking patient to call back.  [facility].

## 2012-08-22 NOTE — Telephone Encounter (Signed)
Yes ok to increase to 1000 mg twice daily if sugars have been stable- ok to send rx as pt requested.  He is due this month for follow up a1c.

## 2012-08-22 NOTE — Telephone Encounter (Signed)
Pt left note when seen 06/12/12 thought metformin 1000 mg taking one tab twice a day; on med list take 1/2 tab twice a day. Pt said running out of med because taking metformin 1000 mg one tab twice a day. Pt request new rx with one tab twice a day [phone]. Pt scheduled f/u appt 09/18/12.Please advise. Pt request call back for clarification.

## 2012-08-22 NOTE — Telephone Encounter (Signed)
Advised patient

## 2012-09-03 ENCOUNTER — Other Ambulatory Visit: Payer: Self-pay | Admitting: Family Medicine

## 2012-09-18 ENCOUNTER — Ambulatory Visit (INDEPENDENT_AMBULATORY_CARE_PROVIDER_SITE_OTHER): Payer: BC Managed Care – PPO | Admitting: Family Medicine

## 2012-09-18 ENCOUNTER — Encounter: Payer: Self-pay | Admitting: Family Medicine

## 2012-09-18 VITALS — BP 110/76 | HR 72 | Temp 97.8°F | Wt 296.0 lb

## 2012-09-18 DIAGNOSIS — I10 Essential (primary) hypertension: Secondary | ICD-10-CM

## 2012-09-18 DIAGNOSIS — E119 Type 2 diabetes mellitus without complications: Secondary | ICD-10-CM

## 2012-09-18 LAB — COMPREHENSIVE METABOLIC PANEL
ALT: 19 U/L (ref 0–53)
Albumin: 4.1 g/dL (ref 3.5–5.2)
BUN: 17 mg/dL (ref 6–23)
CO2: 26 mEq/L (ref 19–32)
Calcium: 9.3 mg/dL (ref 8.4–10.5)
Chloride: 104 mEq/L (ref 96–112)
Creatinine, Ser: 1 mg/dL (ref 0.4–1.5)
GFR: 83.83 mL/min (ref >60.00)

## 2012-09-18 LAB — HEMOGLOBIN A1C: Hgb A1c MFr Bld: 6.8 % — ABNORMAL HIGH (ref 4.6–6.5)

## 2012-09-18 NOTE — Progress Notes (Signed)
Subjective:    Patient ID: Jerry Carpenter, male    DOB: 14-Feb-1971, 42 y.o.   MRN: [id number]  HPI  42 yo male here for 3 month follow up new onset DM.  Saw him on 9/16 for blurred vision.  A1c was markedly elevated.  Lab Results  Component Value Date   HGBA1C 13.6* 05/30/2012   Started him on Metformin 500 mg .twice daily, increased to 1000 mg twice daily. Referred to diabetic education.  Notes reviewed- last saw dietician 07/2012.  Does not check CBGs regularly- fasting typically around 180.  He does feel better.  No recurrent blurred vision.  Tolerating Metformin ok- no abdominal pain, nausea or diarrhea.  Weight has continued to go up- admits to not being very compliant with his diet during the holidays.  Wt Readings from Last 3 Encounters:  09/18/12 296 lb (134.265 kg)  08/07/12 290 lb (131.543 kg)  06/19/12 285 lb 9.6 oz (129.547 kg)      Patient Active Problem List  Diagnosis  . GOUT  . HYPERTENSION  . GERD  . BACK PAIN  . ABSCESS, SKIN  . Loose stools  . Chest pain  . OSA (obstructive sleep apnea)  . Pneumonia  . Hematuria  . Kidney stones  . Blurred vision  . Diabetes mellitus, new onset   Past Medical History  Diagnosis Date  . GERD (gastroesophageal reflux disease)   . Gout   . Allergic rhinitis    Past Surgical History  Procedure Date  . Rotator cuff repair     Right  . Arthroscopic repair acl 1999    Left  . Back surgery    History  Substance Use Topics  . Smoking status: Never Smoker   . Smokeless tobacco: Never Used     Comment: smokes an occ cigar very rarely.  . Alcohol Use: Yes   Family History  Problem Relation Age of Onset  . Heart disease Father   . Hyperlipidemia Other   . Hypertension Other   . Breast cancer Mother   . Prostate cancer Father    Allergies  Allergen Reactions  . Ace Inhibitors     REACTION: cough  . Amoxicillin Itching  . Shellfish Allergy    Current Outpatient Prescriptions on File Prior to Visit    Medication Sig Dispense Refill  . albuterol (PROVENTIL HFA;VENTOLIN HFA) 108 (90 BASE) MCG/ACT inhaler Inhale 2 puffs into the lungs every 6 (six) hours as needed for wheezing.  1 Inhaler  0  . Blood Glucose Monitoring Suppl (ONE TOUCH BASIC SYSTEM) W/DEVICE KIT Please check blood sugar three times daily x 1 week, keep log, then check three times weekly.  1 each  0  . colchicine 0.6 MG tablet TAKE 1 TABLET (0.6 MG TOTAL) DAILY  90 tablet  3  . EXFORGE 5-160 MG per tablet TAKE 1 TABLET BY MOUTH DAILY.  30 tablet  0  . glucose blood (ONE TOUCH ULTRA TEST) test strip Check blood sugar three times weekly  100 each  0  . metFORMIN (GLUCOPHAGE) 1000 MG tablet Take one tablet by mouth twice a day.  28 tablet  0  . ONE TOUCH ULTRA TEST test strip USE TO CHECK BLOOD SUGAR THREE TIMES WEEKLY  300 each  1  . tadalafil (CIALIS) 20 MG tablet Take 1 tablet (20 mg total) by mouth every other day.  45 tablet  3   The PMH, PSH, Social History, Family History, Medications, and allergies have been reviewed in [place],  and have been updated if relevant.    Review of Systems See HPI No increased thirst or urniary No nausea, vomiting or diarrhea No fatigue    Objective:   Physical Exam BP 110/76  Pulse 72  Temp 97.8 F (36.6 C)  Wt 296 lb (134.265 kg)  Wt Readings from Last 3 Encounters:  08/07/12 290 lb (131.543 kg)  06/19/12 285 lb 9.6 oz (129.547 kg)  06/12/12 287 lb (130.182 kg)   Gen:  Alert, pleasant male, NAD HEENT: PERRL Neck:  No bruits Resp:  CTA bilaterally CVS:  RRR Ext:  No edema Neuro:  CN II- XII in tact, no facial droop, strength equal in all four extremities, reflexes normal and symmetrical throughout.     Assessment & Plan:   1. Diabetes mellitus, new onset  Likely will need to add glyburide to metformin.  Check a1c today.  Continue diabetic teaching.   2. HYPERTENSION  Stable on Exforge.

## 2012-09-18 NOTE — Patient Instructions (Addendum)
Good to see you.  I'm glad you joined a gym.  I will call you with your a1c.  Follow up with me in 3 months.

## 2012-09-25 ENCOUNTER — Ambulatory Visit: Payer: BC Managed Care – PPO | Admitting: *Deleted

## 2012-09-27 ENCOUNTER — Other Ambulatory Visit: Payer: Self-pay | Admitting: Family Medicine

## 2012-12-04 ENCOUNTER — Ambulatory Visit: Payer: BC Managed Care – PPO | Admitting: Pulmonary Disease

## 2012-12-18 ENCOUNTER — Ambulatory Visit: Payer: BC Managed Care – PPO | Admitting: Family Medicine

## 2013-01-15 ENCOUNTER — Other Ambulatory Visit: Payer: Self-pay | Admitting: Family Medicine

## 2013-02-20 ENCOUNTER — Ambulatory Visit (INDEPENDENT_AMBULATORY_CARE_PROVIDER_SITE_OTHER): Payer: BC Managed Care – PPO | Admitting: Family Medicine

## 2013-02-20 ENCOUNTER — Other Ambulatory Visit: Payer: Self-pay | Admitting: *Deleted

## 2013-02-20 ENCOUNTER — Encounter: Payer: Self-pay | Admitting: Family Medicine

## 2013-02-20 VITALS — BP 120/88 | HR 80 | Temp 97.8°F | Wt 301.0 lb

## 2013-02-20 DIAGNOSIS — R591 Generalized enlarged lymph nodes: Secondary | ICD-10-CM | POA: Insufficient documentation

## 2013-02-20 DIAGNOSIS — R599 Enlarged lymph nodes, unspecified: Secondary | ICD-10-CM

## 2013-02-20 LAB — CBC WITH DIFFERENTIAL/PLATELET
Basophils Absolute: 0 10*3/uL (ref 0.0–0.1)
Basophils Relative: 0.5 % (ref 0.0–3.0)
Eosinophils Absolute: 0.1 10*3/uL (ref 0.0–0.7)
Eosinophils Relative: 1.4 % (ref 0.0–5.0)
HCT: 44.4 % (ref 39.0–52.0)
Hemoglobin: 14.9 g/dL (ref 13.0–17.0)
Lymphocytes Relative: 24.8 % (ref 12.0–46.0)
Lymphs Abs: 2.3 10*3/uL (ref 0.7–4.0)
MCHC: 33.6 g/dL (ref 30.0–36.0)
MCV: 85.7 fl (ref 78.0–100.0)
Monocytes Absolute: 0.6 10*3/uL (ref 0.1–1.0)
Monocytes Relative: 5.9 % (ref 3.0–12.0)
Neutro Abs: 6.4 10*3/uL (ref 1.4–7.7)
Neutrophils Relative %: 67.4 % (ref 43.0–77.0)
Platelets: 301 10*3/uL (ref 150.0–400.0)
RBC: 5.18 Mil/uL (ref 4.22–5.81)
RDW: 13.4 % (ref 11.5–14.6)
WBC: 9.5 10*3/uL (ref 4.5–10.5)

## 2013-02-20 MED ORDER — DOXYCYCLINE HYCLATE 100 MG PO TABS: 100.0000 mg | ORAL_TABLET | Freq: Two times a day (BID) | ORAL | Status: DC

## 2013-02-20 NOTE — Addendum Note (Signed)
Addended by: Alvina Chou on: 02/20/2013 10:10 AM   Modules accepted: Orders

## 2013-02-20 NOTE — Patient Instructions (Addendum)
Good to see you. Please take doxycyline as directed- 1 tablet twice daily for 10 days.  We will call you with your lab results.  Lymphadenopathy Lymphadenopathy means "disease of the lymph glands." But the term is usually used to describe swollen or enlarged lymph glands, also called lymph nodes. These are the bean-shaped organs found in many locations including the neck, underarm, and groin. Lymph glands are part of the immune system, which fights infections in your body. Lymphadenopathy can occur in just one area of the body, such as the neck, or it can be generalized, with lymph node enlargement in several areas. The nodes found in the neck are the most common sites of lymphadenopathy. CAUSES  When your immune system responds to germs (such as viruses or bacteria ), infection-fighting cells and fluid build up. This causes the glands to grow in size. This is usually not something to worry about. Sometimes, the glands themselves can become infected and inflamed. This is called lymphadenitis. Enlarged lymph nodes can be caused by many diseases:  Bacterial disease, such as strep throat or a skin infection.  Viral disease, such as a common cold.  Other germs, such as lyme disease, tuberculosis, or sexually transmitted diseases.  Cancers, such as lymphoma (cancer of the lymphatic system) or leukemia (cancer of the white blood cells).  Inflammatory diseases such as lupus or rheumatoid arthritis.  Reactions to medications. Many of the diseases above are rare, but important. This is why you should see your caregiver if you have lymphadenopathy. SYMPTOMS   Swollen, enlarged lumps in the neck, back of the head or other locations.  Tenderness.  Warmth or redness of the skin over the lymph nodes.  Fever. DIAGNOSIS  Enlarged lymph nodes are often near the source of infection. They can help healthcare providers diagnose your illness. For instance:   Swollen lymph nodes around the jaw might be  caused by an infection in the mouth.  Enlarged glands in the neck often signal a throat infection.  Lymph nodes that are swollen in more than one area often indicate an illness caused by a virus. Your caregiver most likely will know what is causing your lymphadenopathy after listening to your history and examining you. Blood tests, x-rays or other tests may be needed. If the cause of the enlarged lymph node cannot be found, and it does not go away by itself, then a biopsy may be needed. Your caregiver will discuss this with you. TREATMENT  Treatment for your enlarged lymph nodes will depend on the cause. Many times the nodes will shrink to normal size by themselves, with no treatment. Antibiotics or other medicines may be needed for infection. Only take over-the-counter or prescription medicines for pain, discomfort or fever as directed by your caregiver. HOME CARE INSTRUCTIONS  Swollen lymph glands usually return to normal when the underlying medical condition goes away. If they persist, contact your health-care provider. He/she might prescribe antibiotics or other treatments, depending on the diagnosis. Take any medications exactly as prescribed. Keep any follow-up appointments made to check on the condition of your enlarged nodes.  SEEK MEDICAL CARE IF:   Swelling lasts for more than two weeks.  You have symptoms such as weight loss, night sweats, fatigue or fever that does not go away.  The lymph nodes are hard, seem fixed to the skin or are growing rapidly.  Skin over the lymph nodes is red and inflamed. This could mean there is an infection. SEEK IMMEDIATE MEDICAL CARE IF:  Fluid starts leaking from the area of the enlarged lymph node.  You develop a fever of 102 F (38.9 C) or greater.  Severe pain develops (not necessarily at the site of a large lymph node).  You develop chest pain or shortness of breath.  You develop worsening abdominal pain. MAKE SURE YOU:   Understand  these instructions.  Will watch your condition.  Will get help right away if you are not doing well or get worse. Document Released: 06/08/2008 Document Revised: 11/22/2011 Document Reviewed: 06/08/2008 [facility] Patient Information 2014 [place], [place].

## 2013-02-20 NOTE — Progress Notes (Signed)
Subjective:    Patient ID: Jerry Carpenter, male    DOB: 30-Aug-1971, 42 y.o.   MRN: [id number]  HPI  42 yo male with h/o DM, obesity, here for:  Swollen lymph nodes- a week or two ago, felt kind of achy, had a headache.  Next morning, woke up and his lymph nodes under his arms were "the size of baseballs" and very tender and warm.  He had multiple ticks bites just prior but had removed all of them. No fevers or rashes.  No n/v/d. He does not have cats or exposure to cats.  No sore throat.    Son is being treated for otitis media.  PMH significant for amoxicillin allergy.  FBS have been under good control.  No new medications.  Patient Active Problem List   Diagnosis Date Noted  . Lymphadenopathy 02/20/2013  . Blurred vision 05/29/2012  . Diabetes mellitus, new onset 05/29/2012  . Hematuria 10/12/2011  . Kidney stones 10/12/2011  . Pneumonia 06/24/2011  . OSA (obstructive sleep apnea) 03/15/2011  . Loose stools 02/23/2011  . Chest pain 02/23/2011  . ABSCESS, SKIN 09/30/2010  . GOUT 06/03/2010  . HYPERTENSION 06/03/2010  . GERD 06/03/2010  . BACK PAIN 06/03/2010   Past Medical History  Diagnosis Date  . GERD (gastroesophageal reflux disease)   . Gout   . Allergic rhinitis    Past Surgical History  Procedure Laterality Date  . Rotator cuff repair      Right  . Arthroscopic repair acl  1999    Left  . Back surgery     History  Substance Use Topics  . Smoking status: Never Smoker   . Smokeless tobacco: Never Used     Comment: smokes an occ cigar very rarely.  . Alcohol Use: Yes   Family History  Problem Relation Age of Onset  . Heart disease Father   . Hyperlipidemia Other   . Hypertension Other   . Breast cancer Mother   . Prostate cancer Father    Allergies  Allergen Reactions  . Ace Inhibitors     REACTION: cough  . Amoxicillin Itching  . Shellfish Allergy    Current Outpatient Prescriptions on File Prior to Visit  Medication Sig Dispense Refill   . albuterol (PROVENTIL HFA;VENTOLIN HFA) 108 (90 BASE) MCG/ACT inhaler Inhale 2 puffs into the lungs every 6 (six) hours as needed.      . Blood Glucose Monitoring Suppl (ONE TOUCH BASIC SYSTEM) W/DEVICE KIT Please check blood sugar three times daily x 1 week, keep log, then check three times weekly.  1 each  0  . CIALIS 20 MG tablet TAKE 1 TABLET EVERY OTHER DAY  45 tablet  2  . colchicine 0.6 MG tablet TAKE 1 TABLET (0.6 MG TOTAL) DAILY  90 tablet  3  . EXFORGE 5-160 MG per tablet TAKE 1 TABLET BY MOUTH DAILY.  30 tablet  0  . glucose blood (ONE TOUCH ULTRA TEST) test strip Check blood sugar three times weekly  100 each  0  . metFORMIN (GLUCOPHAGE) 1000 MG tablet Take one tablet by mouth twice a day.  28 tablet  0  . ONE TOUCH ULTRA TEST test strip USE TO CHECK BLOOD SUGAR THREE TIMES WEEKLY  300 each  1   No current facility-administered medications on file prior to visit.   The PMH, PSH, Social History, Family History, Medications, and allergies have been reviewed in [facility], and have been updated if relevant.   Review of Systems  See HPI    Objective:   Physical Exam  Constitutional: He appears well-developed and well-nourished. No distress.  HENT:  Head: Normocephalic.  Eyes: Pupils are equal, round, and reactive to light.  Neck: Normal range of motion. Neck supple.  Cardiovascular: Normal rate and regular rhythm.   Pulmonary/Chest: Effort normal and breath sounds normal. No respiratory distress. He has no wheezes. He has no rales. He exhibits no tenderness.  Abdominal: Soft.  Lymphadenopathy:       Head (right side): No submental, no submandibular, no tonsillar, no preauricular, no posterior auricular and no occipital adenopathy present.       Head (left side): No submental, no submandibular, no tonsillar, no preauricular, no posterior auricular and no occipital adenopathy present.    He has no cervical adenopathy.       Right cervical: No superficial cervical adenopathy present.       Left cervical: No superficial cervical adenopathy present.    He has no axillary adenopathy.  Skin: Skin is warm. No rash noted.  Psychiatric: He has a normal mood and affect. His speech is normal and behavior is normal. Judgment and thought content normal. Cognition and memory are normal.   BP 120/88  Pulse 80  Temp(Src) 97.8 F (36.6 C)  Wt 301 lb (136.533 kg)  BMI 43.19 kg/m2     Assessment & Plan:  1. Lymphadenopathy Resolved.  Differential is quite wide at this point.  Will check Tick borne illness titers, CBC, EBV. Start on doxycyline 100 mg twice daily. Call or return to clinic prn if these symptoms worsen or fail to improve as anticipated. The patient indicates understanding of these issues and agrees with the plan.  - CBC with Differential - Mono (Epstein Barr Virus) - Rocky mtn spotted fvr abs pnl(IgG+IgM) - B. Burgdorfi Antibodies

## 2013-02-21 LAB — ROCKY MTN SPOTTED FVR ABS PNL(IGG+IGM)
RMSF IgG: 0.22 IV
RMSF IgM: 0.29 IV

## 2013-02-21 LAB — B. BURGDORFI ANTIBODIES: B burgdorferi Ab IgG+IgM: 0.17 {ISR}

## 2013-04-08 ENCOUNTER — Other Ambulatory Visit: Payer: Self-pay | Admitting: Family Medicine

## 2013-05-18 ENCOUNTER — Other Ambulatory Visit: Payer: Self-pay

## 2013-05-18 MED ORDER — AMLODIPINE BESYLATE-VALSARTAN 5-160 MG PO TABS: ORAL_TABLET | ORAL | Status: DC

## 2013-05-18 MED ORDER — METFORMIN HCL 1000 MG PO TABS: ORAL_TABLET | ORAL | Status: DC

## 2013-05-18 NOTE — Telephone Encounter (Signed)
Pt left v/m requesting refill exforge and [facility] in [place]. One month given with note needs schedule appt. Left v/m advising pt gave 1 month refill and pt needs to schedule appt.

## 2013-06-27 ENCOUNTER — Other Ambulatory Visit: Payer: Self-pay | Admitting: Family Medicine

## 2013-06-27 NOTE — Telephone Encounter (Signed)
Last filled 03/28/13.

## 2013-07-19 ENCOUNTER — Other Ambulatory Visit: Payer: Self-pay

## 2013-12-25 ENCOUNTER — Ambulatory Visit (INDEPENDENT_AMBULATORY_CARE_PROVIDER_SITE_OTHER): Payer: BC Managed Care – PPO | Admitting: Family Medicine

## 2013-12-25 ENCOUNTER — Encounter: Payer: Self-pay | Admitting: *Deleted

## 2013-12-25 ENCOUNTER — Encounter: Payer: Self-pay | Admitting: Family Medicine

## 2013-12-25 VITALS — BP 120/80 | HR 74 | Temp 98.0°F | Ht 69.0 in | Wt 286.5 lb

## 2013-12-25 DIAGNOSIS — E119 Type 2 diabetes mellitus without complications: Secondary | ICD-10-CM

## 2013-12-25 DIAGNOSIS — I10 Essential (primary) hypertension: Secondary | ICD-10-CM

## 2013-12-25 LAB — COMPREHENSIVE METABOLIC PANEL
ALK PHOS: 79 U/L (ref 39–117)
ALT: 22 U/L (ref 0–53)
AST: 24 U/L (ref 0–37)
Albumin: 4.3 g/dL (ref 3.5–5.2)
BILIRUBIN TOTAL: 0.6 mg/dL (ref 0.3–1.2)
BUN: 15 mg/dL (ref 6–23)
CO2: 26 mEq/L (ref 19–32)
CREATININE: 1.1 mg/dL (ref 0.4–1.5)
Calcium: 9.3 mg/dL (ref 8.4–10.5)
Chloride: 104 mEq/L (ref 96–112)
GFR: 81.5 mL/min (ref >60.00)
GLUCOSE: 117 mg/dL — AB (ref 70–99)
Potassium: 4.3 mEq/L (ref 3.5–5.1)
Sodium: 138 mEq/L (ref 135–145)
TOTAL PROTEIN: 7.7 g/dL (ref 6.0–8.3)

## 2013-12-25 LAB — HEMOGLOBIN A1C: HEMOGLOBIN A1C: 6.3 % (ref 4.6–6.5)

## 2013-12-25 LAB — MICROALBUMIN / CREATININE URINE RATIO
Creatinine,U: 125 mg/dL
MICROALB UR: 1.2 mg/dL (ref 0.0–1.9)
MICROALB/CREAT RATIO: 1 mg/g (ref 0.0–30.0)

## 2013-12-25 MED ORDER — AMLODIPINE BESYLATE-VALSARTAN 5-160 MG PO TABS: ORAL_TABLET | ORAL | Status: DC

## 2013-12-25 MED ORDER — METFORMIN HCL 1000 MG PO TABS: ORAL_TABLET | ORAL | Status: DC

## 2013-12-25 MED ORDER — TADALAFIL 20 MG PO TABS: ORAL_TABLET | ORAL | Status: DC

## 2013-12-25 NOTE — Progress Notes (Signed)
Subjective:    Patient ID: Jerry Carpenter, male    DOB: Apr 06, 1971, 43 y.o.   MRN: [id number]  HPI  43 yo male with h/o HTN and DM whom has not followed up for routine care in over a year here for med refills.  DM-  Lab Results  Component Value Date   HGBA1C 6.8* 09/18/2012   On Metformin 000 mg twice daily. Referred to diabetic education.  Notes reviewed- last saw dietician 07/2012.  Does not check CBGs regularly..  Tolerating Metformin ok- no abdominal pain, nausea or diarrhea.  Well overdue for a1c.  Has lost weight since his last office visit (02/2013).  Has changed his diet.  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  Wt Readings from Last 3 Encounters:  12/25/13 286 lb 8 oz (129.956 kg)  02/20/13 301 lb (136.533 kg)  09/18/12 296 lb (134.265 kg)   HTN- on Exforge.  Denies any CP, blurred vision or SOB.   Patient Active Problem List   Diagnosis Date Noted  . Lymphadenopathy 02/20/2013  . Blurred vision 05/29/2012  . Diabetes mellitus, new onset 05/29/2012  . Hematuria 10/12/2011  . Kidney stones 10/12/2011  . Pneumonia 06/24/2011  . OSA (obstructive sleep apnea) 03/15/2011  . Loose stools 02/23/2011  . Chest pain 02/23/2011  . ABSCESS, SKIN 09/30/2010  . GOUT 06/03/2010  . HYPERTENSION 06/03/2010  . GERD 06/03/2010  . BACK PAIN 06/03/2010   Past Medical History  Diagnosis Date  . GERD (gastroesophageal reflux disease)   . Gout   . Allergic rhinitis    Past Surgical History  Procedure Laterality Date  . Rotator cuff repair      Right  . Arthroscopic repair acl  1999    Left  . Back surgery     History  Substance Use Topics  . Smoking status: Never Smoker   . Smokeless tobacco: Never Used     Comment: smokes an occ cigar very rarely.  . Alcohol Use: Yes   Family History  Problem Relation Age of Onset  . Heart disease Father   . Hyperlipidemia Other   . Hypertension Other   . Breast cancer Mother   . Prostate cancer Father    Allergies   Allergen Reactions  . Ace Inhibitors     REACTION: cough  . Amoxicillin Itching  . Shellfish Allergy    Current Outpatient Prescriptions on File Prior to Visit  Medication Sig Dispense Refill  . albuterol (PROVENTIL HFA;VENTOLIN HFA) 108 (90 BASE) MCG/ACT inhaler Inhale 2 puffs into the lungs every 6 (six) hours as needed.      Marland Kitchen amLODipine-valsartan (EXFORGE) 5-160 MG per tablet TAKE 1 TABLET BY MOUTH DAILY.  30 tablet  0  . Blood Glucose Monitoring Suppl ([place]) W/DEVICE KIT Please check blood sugar three times daily x 1 week, keep log, then check three times weekly.  1 each  0  . colchicine 0.6 MG tablet TAKE 1 TABLET (0.6 MG TOTAL) DAILY  90 tablet  3  . glucose blood (ONE TOUCH ULTRA TEST) test strip Check blood sugar three times weekly  100 each  0  . ONE TOUCH ULTRA TEST test strip USE TO CHECK BLOOD SUGAR THREE TIMES WEEKLY  300 each  1   No current facility-administered medications on file prior to visit.   The PMH, PSH, Social History, Family History, Medications, and allergies have been reviewed in [facility], and have been updated if relevant.    Review of Systems  See HPI No increased thirst or urniary No nausea, vomiting or diarrhea No fatigue    Objective:   Physical Exam BP 120/80  Pulse 74  Temp(Src) 98 F (36.7 C) (Oral)  Ht '5\' 9"'  (1.753 m)  Wt 286 lb 8 oz (129.956 kg)  BMI 42.29 kg/m2  SpO2 96%  Wt Readings from Last 3 Encounters:  12/25/13 286 lb 8 oz (129.956 kg)  02/20/13 301 lb (136.533 kg)  09/18/12 296 lb (134.265 kg)   Gen:  Alert, pleasant male, NAD HEENT: PERRL Neck:  No bruits Resp:  CTA bilaterally CVS:  RRR Ext:  No edema Neuro:  CN II- XII in tact, no facial droop, strength equal in all four extremities, reflexes normal and symmetrical throughout. Diabetic foot exam: Normal inspection No skin breakdown No calluses  Normal DP pulses Normal sensation to light touch and monofilament Nails normal      Assessment &  Plan:

## 2013-12-25 NOTE — Patient Instructions (Signed)
Great to see you. We will call you with your lab results.  [facility] for me.

## 2013-12-25 NOTE — Assessment & Plan Note (Signed)
Well controlled. No changes. 

## 2013-12-25 NOTE — Assessment & Plan Note (Signed)
Overdue for labs. Check a1c, urine micro, renal fxn today.

## 2013-12-26 ENCOUNTER — Telehealth: Payer: Self-pay

## 2013-12-26 ENCOUNTER — Other Ambulatory Visit: Payer: Self-pay | Admitting: Family Medicine

## 2013-12-26 ENCOUNTER — Telehealth: Payer: Self-pay | Admitting: Family Medicine

## 2013-12-26 NOTE — Telephone Encounter (Signed)
[facility] [place] sent to pharmacy instead of exforge; [facility] [place] and amlodipine valsartan (Exforge) 5-160 was received but pharmacy had to order med and should be in sometime today. Advised pt and he voiced understanding and pt will ck with pharmacy later today.

## 2013-12-26 NOTE — Telephone Encounter (Signed)
Relevant patient education assigned to patient using Emmi. ° °

## 2013-12-27 ENCOUNTER — Other Ambulatory Visit: Payer: Self-pay | Admitting: *Deleted

## 2013-12-27 MED ORDER — TADALAFIL 20 MG PO TABS: ORAL_TABLET | ORAL | Status: DC

## 2013-12-27 NOTE — Telephone Encounter (Signed)
Dosage to be changed for insurance approval.

## 2014-08-12 ENCOUNTER — Other Ambulatory Visit: Payer: Self-pay | Admitting: Family Medicine

## 2014-09-24 ENCOUNTER — Other Ambulatory Visit: Payer: Self-pay | Admitting: Family Medicine

## 2014-12-18 ENCOUNTER — Other Ambulatory Visit: Payer: Self-pay | Admitting: Family Medicine

## 2015-03-04 ENCOUNTER — Other Ambulatory Visit: Payer: Self-pay | Admitting: Family Medicine

## 2015-04-16 ENCOUNTER — Encounter: Payer: Self-pay | Admitting: Family Medicine

## 2015-04-16 ENCOUNTER — Ambulatory Visit (INDEPENDENT_AMBULATORY_CARE_PROVIDER_SITE_OTHER): Payer: Self-pay | Admitting: Family Medicine

## 2015-04-16 VITALS — BP 144/86 | HR 82 | Temp 97.8°F | Wt 273.0 lb

## 2015-04-16 DIAGNOSIS — E119 Type 2 diabetes mellitus without complications: Secondary | ICD-10-CM

## 2015-04-16 DIAGNOSIS — I10 Essential (primary) hypertension: Secondary | ICD-10-CM

## 2015-04-16 DIAGNOSIS — M25561 Pain in right knee: Secondary | ICD-10-CM

## 2015-04-16 LAB — COMPREHENSIVE METABOLIC PANEL
ALBUMIN: 4.1 g/dL (ref 3.5–5.2)
ALT: 13 U/L (ref 0–53)
AST: 16 U/L (ref 0–37)
Alkaline Phosphatase: 83 U/L (ref 39–117)
BILIRUBIN TOTAL: 0.4 mg/dL (ref 0.2–1.2)
BUN: 19 mg/dL (ref 6–23)
CO2: 29 meq/L (ref 19–32)
Calcium: 9.5 mg/dL (ref 8.4–10.5)
Chloride: 101 mEq/L (ref 96–112)
Creatinine, Ser: 1.05 mg/dL (ref 0.40–1.50)
GFR: 81.02 mL/min (ref >60.00)
GLUCOSE: 116 mg/dL — AB (ref 70–99)
Potassium: 4.2 mEq/L (ref 3.5–5.1)
Sodium: 138 mEq/L (ref 135–145)
TOTAL PROTEIN: 7.8 g/dL (ref 6.0–8.3)

## 2015-04-16 LAB — LIPID PANEL
Cholesterol: 112 mg/dL (ref 0–200)
HDL: 33.2 mg/dL — AB (ref >39.00)
LDL CALC: 59 mg/dL (ref 0–99)
NonHDL: 79.18
Total CHOL/HDL Ratio: 3
Triglycerides: 100 mg/dL (ref 0.0–149.0)
VLDL: 20 mg/dL (ref 0.0–40.0)

## 2015-04-16 LAB — HEMOGLOBIN A1C: HEMOGLOBIN A1C: 6.5 % (ref 4.6–6.5)

## 2015-04-16 MED ORDER — METFORMIN HCL 1000 MG PO TABS: 1000.0000 mg | ORAL_TABLET | Freq: Two times a day (BID) | ORAL | Status: DC

## 2015-04-16 MED ORDER — MELOXICAM 15 MG PO TABS: 15.0000 mg | ORAL_TABLET | Freq: Every day | ORAL | Status: AC

## 2015-04-16 MED ORDER — AMLODIPINE BESYLATE-VALSARTAN 5-160 MG PO TABS: ORAL_TABLET | ORAL | Status: DC

## 2015-04-16 NOTE — Assessment & Plan Note (Signed)
Not quite at goal for a diabetic. Just restarted rxs.

## 2015-04-16 NOTE — Progress Notes (Signed)
Subjective:    Patient ID: Jerry Carpenter, male    DOB: 1971-04-06, 44 y.o.   MRN: [id number]  HPI  44 yo male with h/o HTN and DM whom has not followed up for routine care in over a year here for med refills. Originally here for right knee pain but has seen been seen and treated by ortho for meniscal injury.  DM-  Lab Results  Component Value Date   HGBA1C 6.3 12/25/2013   On Metformin 1000 mg twice daily but has been out for over a month. Does not check FSBS regularly..  Tolerating Metformin ok- no abdominal pain, nausea or diarrhea.  Well overdue for a1c.  Has lost weight since his last office visit (02/2013).  Has changed his diet.    Wt Readings from Last 3 Encounters:  04/16/15 273 lb (123.832 kg)  12/25/13 286 lb 8 oz (129.956 kg)  02/20/13 301 lb (136.533 kg)   HTN- on Exforge.  Denies any CP, blurred vision or SOB.   Patient Active Problem List   Diagnosis Date Noted  . Right knee pain 04/16/2015  . Lymphadenopathy 02/20/2013  . Blurred vision 05/29/2012  . Diabetes mellitus, new onset 05/29/2012  . Hematuria 10/12/2011  . Kidney stones 10/12/2011  . Pneumonia 06/24/2011  . OSA (obstructive sleep apnea) 03/15/2011  . Loose stools 02/23/2011  . Chest pain 02/23/2011  . ABSCESS, SKIN 09/30/2010  . GOUT 06/03/2010  . Essential hypertension 06/03/2010  . GERD 06/03/2010  . BACK PAIN 06/03/2010   Past Medical History  Diagnosis Date  . GERD (gastroesophageal reflux disease)   . Gout   . Allergic rhinitis    Past Surgical History  Procedure Laterality Date  . Rotator cuff repair      Right  . Arthroscopic repair acl  1999    Left  . Back surgery     History  Substance Use Topics  . Smoking status: Never Smoker   . Smokeless tobacco: Never Used     Comment: smokes an occ cigar very rarely.  . Alcohol Use: Yes   Family History  Problem Relation Age of Onset  . Heart disease Father   . Hyperlipidemia Other   . Hypertension Other   . Breast cancer  Mother   . Prostate cancer Father    Allergies  Allergen Reactions  . Ace Inhibitors     REACTION: cough  . Amoxicillin Itching  . Shellfish Allergy    Current Outpatient Prescriptions on File Prior to Visit  Medication Sig Dispense Refill  . amLODipine-valsartan (EXFORGE) 5-160 MG per tablet TAKE 1 TABLET DAILY 90 tablet 1  . Blood Glucose Monitoring Suppl ([place]) W/DEVICE KIT Please check blood sugar three times daily x 1 week, keep log, then check three times weekly. 1 each 0  . colchicine 0.6 MG tablet TAKE 1 TABLET (0.6 MG TOTAL) DAILY 90 tablet 3  . glucose blood (ONE TOUCH ULTRA TEST) test strip Check blood sugar three times weekly 100 each 0  . metFORMIN (GLUCOPHAGE) 1000 MG tablet TAKE ONE TABLET BY MOUTH TWICE A DAY. 60 tablet 0  . ONE TOUCH ULTRA TEST test strip USE TO CHECK BLOOD SUGAR THREE TIMES WEEKLY 300 each 1  . tadalafil (CIALIS) 20 MG tablet TAKE 1 tab before sex as directed 45 tablet 0   No current facility-administered medications on file prior to visit.   The PMH, PSH, Social History, Family History, Medications, and allergies have been reviewed in [facility], and have  been updated if relevant.    Review of Systems Review of Systems  Constitutional: Negative.   HENT: Negative.   Respiratory: Negative.   Cardiovascular: Negative.   Gastrointestinal: Negative.   Endocrine: Negative.   Genitourinary: Negative.   Musculoskeletal: Positive for arthralgias and gait problem.  Allergic/Immunologic: Negative.   Neurological: Negative.   Hematological: Negative.   Psychiatric/Behavioral: Negative.   All other systems reviewed and are negative.   Objective:   Physical Exam BP 144/86 mmHg  Pulse 82  Temp(Src) 97.8 F (36.6 C) (Oral)  Wt 273 lb (123.832 kg)  SpO2 97%  Wt Readings from Last 3 Encounters:  04/16/15 273 lb (123.832 kg)  12/25/13 286 lb 8 oz (129.956 kg)  02/20/13 301 lb (136.533 kg)   Physical Exam  Constitutional: He is  well-developed, well-nourished, and in no distress. No distress.  HENT:  Head: Normocephalic.  Eyes: Conjunctivae are normal.  Cardiovascular: Normal rate and regular rhythm.   Pulmonary/Chest: Effort normal and breath sounds normal.  Musculoskeletal:  Ataxic gait  Neurological: He is alert.  Skin: Skin is warm and dry.  Psychiatric: Mood, memory, affect and judgment normal.  Nursing note and vitals reviewed.        Assessment & Plan:

## 2015-04-16 NOTE — Assessment & Plan Note (Signed)
Due for labs. a1c will likely be high as he was not taking rxs for a month. Orders Placed This Encounter  Procedures  . Hemoglobin A1c  . Comprehensive metabolic panel  . Lipid panel

## 2015-04-16 NOTE — Progress Notes (Signed)
Pre visit review using our clinic review tool, if applicable. No additional management support is needed unless otherwise documented below in the visit note. 

## 2015-04-17 ENCOUNTER — Encounter: Payer: Self-pay | Admitting: *Deleted

## 2015-10-31 ENCOUNTER — Other Ambulatory Visit: Payer: Self-pay | Admitting: Family Medicine

## 2016-04-26 ENCOUNTER — Other Ambulatory Visit: Payer: Self-pay | Admitting: Family Medicine

## 2016-06-04 ENCOUNTER — Other Ambulatory Visit: Payer: Self-pay | Admitting: Family Medicine

## 2016-06-16 ENCOUNTER — Ambulatory Visit (INDEPENDENT_AMBULATORY_CARE_PROVIDER_SITE_OTHER): Payer: Self-pay | Admitting: Family Medicine

## 2016-06-16 ENCOUNTER — Encounter: Payer: Self-pay | Admitting: Family Medicine

## 2016-06-16 VITALS — BP 146/89 | HR 75 | Temp 97.5°F | Ht 69.5 in | Wt 250.2 lb

## 2016-06-16 DIAGNOSIS — M25561 Pain in right knee: Secondary | ICD-10-CM

## 2016-06-16 DIAGNOSIS — M7022 Olecranon bursitis, left elbow: Secondary | ICD-10-CM

## 2016-06-16 DIAGNOSIS — Z23 Encounter for immunization: Secondary | ICD-10-CM

## 2016-06-16 DIAGNOSIS — M10472 Other secondary gout, left ankle and foot: Secondary | ICD-10-CM

## 2016-06-16 MED ORDER — PREDNISONE 20 MG PO TABS: ORAL_TABLET | ORAL | 0 refills | Status: DC

## 2016-06-16 NOTE — Progress Notes (Signed)
Dr. Frederico Hamman T. Bocephus Cali, MD, [facility] [place], [address] Phone: [phone] Fax: [phone]  06/16/2016  Patient: Jerry Carpenter, MRN: [id number], DOB: [birth year], 45 y.o.  Primary Physician:  Arnette Norris, MD   Chief Complaint  Patient presents with  . Elbow Pain    Left  . Knee Pain    Right  . Gout    Left Foot   Subjective:   Jerry Carpenter is a 45 y.o. very pleasant male patient who presents with the following:  Multiple symptoms. Swelling at the left elbow with a small olecranon bursitis. This is been present for a month, and previously has been bigger than this.  Right knee is also somewhat tender, and having tenderness on the medial joint line. Previously he did have a aspiration and injection done at [facility] urgent care. Arthritis per their radiographs.  Patient also does have a history of gout, he was recently at the beach, and he has a flareup of his first MTP on the left with pain, redness, and swelling.  Past Medical History, Surgical History, Social History, Family History, Problem List, Medications, and Allergies have been reviewed and updated if relevant.  Patient Active Problem List   Diagnosis Date Noted  . Right knee pain 04/16/2015  . Lymphadenopathy 02/20/2013  . Diabetes mellitus, new onset ([place]) 05/29/2012  . Hematuria 10/12/2011  . Kidney stones 10/12/2011  . OSA (obstructive sleep apnea) 03/15/2011  . Loose stools 02/23/2011  . GOUT 06/03/2010  . Essential hypertension 06/03/2010  . GERD 06/03/2010    Past Medical History:  Diagnosis Date  . Allergic rhinitis   . GERD (gastroesophageal reflux disease)   . Gout     Past Surgical History:  Procedure Laterality Date  . ARTHROSCOPIC REPAIR ACL  1999   Left  . BACK SURGERY    . ROTATOR CUFF REPAIR     Right    Social History   Social History  . Marital status: Divorced    Spouse name: N/A  . Number of children: Y  . Years  of education: N/A   Occupational History  . [employer] planner.  Former [place] Topics  . Smoking status: Never Smoker  . Smokeless tobacco: Never Used     Comment: smokes an occ cigar very rarely.  . Alcohol use Yes  . Drug use: No  . Sexual activity: Not on file   Other Topics Concern  . Not on file   Social History Narrative   Lives in [place], has shared custody of 66 year old son Miquel Dunn   Lives with his parents and son.    Family History  Problem Relation Age of Onset  . Heart disease Father   . Hyperlipidemia Other   . Hypertension Other   . Breast cancer Mother   . Prostate cancer Father     Allergies  Allergen Reactions  . Ace Inhibitors     REACTION: cough  . Amoxicillin Itching  . Shellfish Allergy   . Latex Rash and Swelling    Medication list reviewed and updated in full in [place].  GEN: No fevers, chills. Nontoxic. Primarily MSK c/o today. MSK: Detailed in the HPI GI: tolerating PO intake without difficulty Neuro: No numbness, parasthesias, or tingling associated. Otherwise the pertinent positives of the ROS are noted above.   Objective:   BP (!) 146/89   Pulse 75   Temp  97.5 F (36.4 C) (Oral)   Ht 5' 9.5" (1.765 m)   Wt 250 lb 4 oz (113.5 kg)   BMI 36.43 kg/m    GEN: WDWN, NAD, Non-toxic, Alert & Oriented x 3 HEENT: Atraumatic, Normocephalic.  Ears and Nose: No external deformity. EXTR: No clubbing/cyanosis/edema NEURO: Normal gait.  PSYCH: Normally interactive. Conversant. Not depressed or anxious appearing.  Calm demeanor.    Pain, redness, and swelling at the first MTP on the left.  Modest swelling at the olecranon bursa on the left. Nontender. This is not red. Full range of motion at the elbow.  Right knee: Full extension. Flexion to 105. Tenderness on the medial joint line and no significant tender some lateral joint line. Stable to varus and valgus stress. Anterior cruciate ligament and  PCL are stable. Pain with flexion. McMurray's is positive for pain.  Radiology: No results found.  Assessment and Plan:   Acute gout due to other secondary cause involving toe of left foot  Need for prophylactic vaccination and inoculation against influenza - Plan: Flu Vaccine QUAD 36+ mos IM  Need for prophylactic vaccination with combined diphtheria-tetanus-pertussis (DTP) vaccine - Plan: Tdap vaccine greater than or equal to 7yo IM  Olecranon bursitis, left elbow  Acute pain of right knee  Gout flare of the first MTP on the left. Acute prednisone for now. If failure, indomethacin and colchicine.  Knee pain: OA flare versus gout involvement. Treatment as above.  Nontender mild with the appearance of aseptic a R on bursitis. I reassured the patient, recommended some compression bandages while exerting, but no further care at this point. To me this appears to have less than 3-4 cc of fluid in total.  Follow-up: prn  New Prescriptions   PREDNISONE (DELTASONE) 20 MG TABLET    2 tabs po for 5 days, then 1 tab po for 5 days   Orders Placed This Encounter  Procedures  . Flu Vaccine QUAD 36+ mos IM  . Tdap vaccine greater than or equal to 7yo IM    Signed,  Laini Urick T. Gabrielly Mccrystal, MD   Patient's Medications  New Prescriptions   PREDNISONE (DELTASONE) 20 MG TABLET    2 tabs po for 5 days, then 1 tab po for 5 days  Previous Medications   AMLODIPINE-VALSARTAN (EXFORGE) 5-160 MG TABLET    Take 1 tablet by mouth daily. COMPLETE PHYSICAL EXAM REQUIRED FOR ADDITIONAL REFILLS   BLOOD GLUCOSE MONITORING SUPPL (ONE TOUCH BASIC SYSTEM) W/DEVICE KIT    Please check blood sugar three times daily x 1 week, keep log, then check three times weekly.   COLCHICINE 0.6 MG TABLET    TAKE 1 TABLET (0.6 MG TOTAL) DAILY   GLUCOSE BLOOD (ONE TOUCH ULTRA TEST) TEST STRIP    Check blood sugar three times weekly   MELOXICAM (MOBIC) 15 MG TABLET    Take 1 tablet (15 mg total) by mouth daily.   METFORMIN  (GLUCOPHAGE) 1000 MG TABLET    Take 1 tablet (1,000 mg total) by mouth 2 (two) times daily with a meal. COMPLETE PHYSICAL REQUIRED FOR ADDITIONAL REFILLS   ONE TOUCH ULTRA TEST TEST STRIP    USE TO CHECK BLOOD SUGAR THREE TIMES WEEKLY   TADALAFIL (CIALIS) 20 MG TABLET    TAKE 1 tab before sex as directed  Modified Medications   No medications on file  Discontinued Medications   No medications on file

## 2016-06-16 NOTE — Progress Notes (Signed)
Pre visit review using our clinic review tool, if applicable. No additional management support is needed unless otherwise documented below in the visit note. 

## 2016-07-16 ENCOUNTER — Other Ambulatory Visit: Payer: Self-pay | Admitting: Family Medicine

## 2016-07-16 NOTE — Telephone Encounter (Signed)
Needs OV for refills.  Ok to refill enough until appointment.

## 2016-07-16 NOTE — Telephone Encounter (Signed)
Last f/u 04/2015 

## 2016-07-16 NOTE — Telephone Encounter (Signed)
Lm on pts vm informing him Rx sent to pharmacy for 30d supply. Pt advised to contact office and schedule f/u and was informed no additional refills to be granted until seen

## 2016-08-23 ENCOUNTER — Encounter: Payer: Self-pay | Admitting: Family Medicine

## 2016-08-23 ENCOUNTER — Ambulatory Visit (INDEPENDENT_AMBULATORY_CARE_PROVIDER_SITE_OTHER): Payer: Commercial Managed Care - HMO | Admitting: Family Medicine

## 2016-08-23 VITALS — BP 118/82 | HR 68 | Temp 97.7°F | Wt 258.0 lb

## 2016-08-23 DIAGNOSIS — I10 Essential (primary) hypertension: Secondary | ICD-10-CM

## 2016-08-23 DIAGNOSIS — M1000 Idiopathic gout, unspecified site: Secondary | ICD-10-CM

## 2016-08-23 DIAGNOSIS — E119 Type 2 diabetes mellitus without complications: Secondary | ICD-10-CM

## 2016-08-23 LAB — COMPREHENSIVE METABOLIC PANEL
ALT: 14 U/L (ref 0–53)
AST: 16 U/L (ref 0–37)
Albumin: 4.5 g/dL (ref 3.5–5.2)
Alkaline Phosphatase: 68 U/L (ref 39–117)
BUN: 11 mg/dL (ref 6–23)
CO2: 27 meq/L (ref 19–32)
Calcium: 9.4 mg/dL (ref 8.4–10.5)
Chloride: 104 mEq/L (ref 96–112)
Creatinine, Ser: 1.02 mg/dL (ref 0.40–1.50)
GFR: 83.28 mL/min (ref >60.00)
GLUCOSE: 100 mg/dL — AB (ref 70–99)
Potassium: 4.6 mEq/L (ref 3.5–5.1)
SODIUM: 140 meq/L (ref 135–145)
Total Bilirubin: 0.6 mg/dL (ref 0.2–1.2)
Total Protein: 7.5 g/dL (ref 6.0–8.3)

## 2016-08-23 LAB — LIPID PANEL
CHOL/HDL RATIO: 3
Cholesterol: 110 mg/dL (ref 0–200)
HDL: 38.4 mg/dL — AB (ref >39.00)
LDL Cholesterol: 59 mg/dL (ref 0–99)
NONHDL: 71.3
Triglycerides: 63 mg/dL (ref 0.0–149.0)
VLDL: 12.6 mg/dL (ref 0.0–40.0)

## 2016-08-23 LAB — HEMOGLOBIN A1C: HEMOGLOBIN A1C: 5.7 % (ref 4.6–6.5)

## 2016-08-23 MED ORDER — METFORMIN HCL 1000 MG PO TABS: ORAL_TABLET | ORAL | 3 refills | Status: DC

## 2016-08-23 MED ORDER — PREDNISONE 20 MG PO TABS: ORAL_TABLET | ORAL | 0 refills | Status: DC

## 2016-08-23 MED ORDER — AMLODIPINE BESYLATE-VALSARTAN 5-160 MG PO TABS: ORAL_TABLET | ORAL | 3 refills | Status: DC

## 2016-08-23 NOTE — Assessment & Plan Note (Signed)
Discussed dangers of regular bursts of prednisone on bone density, blood sugar, and adrenal system.  Advised to use with caution. The patient indicates understanding of these issues and agrees with the plan.

## 2016-08-23 NOTE — Patient Instructions (Signed)
Great to see you. Happy Holidays!  We will call you with your results from today. 

## 2016-08-23 NOTE — Progress Notes (Signed)
Pre visit review using our clinic review tool, if applicable. No additional management support is needed unless otherwise documented below in the visit note. 

## 2016-08-23 NOTE — Assessment & Plan Note (Signed)
Continue current dose of metformin. Labs today. Orders Placed This Encounter  Procedures  . Hemoglobin A1c  . Comprehensive metabolic panel  . Lipid panel

## 2016-08-23 NOTE — Assessment & Plan Note (Signed)
Well controlled. eRx refills sent. Due for labs today.

## 2016-08-23 NOTE — Progress Notes (Signed)
Subjective:   Patient ID: Jerry Carpenter, male    DOB: 19-Jan-1971, 45 y.o.   MRN: [id number]  Jerry Carpenter is a pleasant 45 y.o. year old male who presents to clinic today with Diabetes; Hypertension; and Gout  on 08/23/2016  HPI:  DM- currently taking Metformin 1000 mg twice daily.  Does not check FSBS regularly. Denies any episodes of hypoglycemia. Lab Results  Component Value Date   HGBA1C 6.5 04/16/2015   Lab Results  Component Value Date   CHOL 112 04/16/2015   HDL 33.20 (L) 04/16/2015   LDLCALC 59 04/16/2015   LDLDIRECT 67.2 06/03/2010   TRIG 100.0 04/16/2015   CHOLHDL 3 04/16/2015   HTN- BP has been well controlled.  Compliant with rxs.  Has been working on diet and exercise.  Now working at the [employer] and losing weight.  Wt Readings from Last 3 Encounters:  08/23/16 258 lb (117 kg)  06/16/16 250 lb 4 oz (113.5 kg)  04/16/15 273 lb (123.8 kg)   Lab Results  Component Value Date   CREATININE 1.05 04/16/2015   Gout- diagnosed with gout by ortho.  Could not afford colchine.  Unfortunately has been taking prednisone for acute flares.    Current Outpatient Prescriptions on File Prior to Visit  Medication Sig Dispense Refill  . Blood Glucose Monitoring Suppl ([facility]) W/DEVICE KIT Please check blood sugar three times daily x 1 week, keep log, then check three times weekly. 1 each 0  . colchicine 0.6 MG tablet TAKE 1 TABLET (0.6 MG TOTAL) DAILY 90 tablet 3  . glucose blood (ONE TOUCH ULTRA TEST) test strip Check blood sugar three times weekly 100 each 0  . meloxicam (MOBIC) 15 MG tablet Take 1 tablet (15 mg total) by mouth daily. 30 tablet 0  . ONE TOUCH ULTRA TEST test strip USE TO CHECK BLOOD SUGAR THREE TIMES WEEKLY 300 each 1   No current facility-administered medications on file prior to visit.     Allergies  Allergen Reactions  . Ace Inhibitors     REACTION: cough  . Amoxicillin Itching  . Shellfish Allergy   . Latex Rash and Swelling     Past Medical History:  Diagnosis Date  . Allergic rhinitis   . GERD (gastroesophageal reflux disease)   . Gout     Past Surgical History:  Procedure Laterality Date  . ARTHROSCOPIC REPAIR ACL  1999   Left  . BACK SURGERY    . ROTATOR CUFF REPAIR     Right    Family History  Problem Relation Age of Onset  . Heart disease Father   . Hyperlipidemia Other   . Hypertension Other   . Breast cancer Mother   . Prostate cancer Father     Social History   Social History  . Marital status: Divorced    Spouse name: N/A  . Number of children: Y  . Years of education: N/A   Occupational History  . [employer] planner.  Former [place] Topics  . Smoking status: Never Smoker  . Smokeless tobacco: Never Used     Comment: smokes an occ cigar very rarely.  . Alcohol use Yes  . Drug use: No  . Sexual activity: Not on file   Other Topics Concern  . Not on file   Social History Narrative   Lives in [place], has shared custody of 54 year old son Miquel Dunn   Lives with his parents  and son.   The PMH, PSH, Social History, Family History, Medications, and allergies have been reviewed in [facility], and have been updated if relevant.   Review of Systems  Constitutional: Negative.   HENT: Negative.   Eyes: Negative.   Respiratory: Negative.   Cardiovascular: Negative.   Endocrine: Negative.   Genitourinary: Negative.   Musculoskeletal: Positive for arthralgias.  Allergic/Immunologic: Negative.   Neurological: Negative.   Hematological: Negative.   Psychiatric/Behavioral: Negative.        Objective:    BP 118/82 (BP Location: Left Arm, Patient Position: Sitting, Cuff Size: Large)   Pulse 68   Temp 97.7 F (36.5 C) (Oral)   Wt 258 lb (117 kg)   SpO2 97%   BMI 37.55 kg/m   Wt Readings from Last 3 Encounters:  08/23/16 258 lb (117 kg)  06/16/16 250 lb 4 oz (113.5 kg)  04/16/15 273 lb (123.8 kg)    Physical Exam  General:  pleasant male in  no acute distress Eyes:  PERRL Ears:  External ear exam shows no significant lesions or deformities.  TMs normal bilaterally Hearing is grossly normal bilaterally. Nose:  External nasal examination shows no deformity or inflammation. Nasal mucosa are pink and moist without lesions or exudates. Mouth:  Oral mucosa and oropharynx without lesions or exudates.  Teeth in good repair. Neck:  no carotid bruit or thyromegaly no cervical or supraclavicular lymphadenopathy  Lungs:  Normal respiratory effort, chest expands symmetrically. Lungs are clear to auscultation, no crackles or wheezes. Heart:  Normal rate and regular rhythm. S1 and S2 normal without gallop, murmur, click, rub or other extra sounds. Abdomen:  Bowel sounds positive,abdomen soft and non-tender without masses, organomegaly or hernias noted.. Pulses:  R and L posterior tibial pulses are full and equal bilaterally  Extremities:  no edema  Psych:  Good eye contact, not anxious or depressed appearing      Assessment & Plan:   Acute idiopathic gout, unspecified site  Essential hypertension  Diabetes mellitus, new onset ([place]) No Follow-up on file.

## 2016-08-24 ENCOUNTER — Ambulatory Visit: Payer: Self-pay | Admitting: Family Medicine

## 2016-09-02 ENCOUNTER — Other Ambulatory Visit: Payer: Self-pay | Admitting: *Deleted

## 2016-09-02 MED ORDER — METFORMIN HCL 1000 MG PO TABS: ORAL_TABLET | ORAL | 2 refills | Status: DC

## 2016-09-02 MED ORDER — AMLODIPINE BESYLATE-VALSARTAN 5-160 MG PO TABS: ORAL_TABLET | ORAL | 2 refills | Status: DC

## 2016-12-13 ENCOUNTER — Other Ambulatory Visit: Payer: Self-pay | Admitting: *Deleted

## 2016-12-13 NOTE — Telephone Encounter (Signed)
Pt requesting medication refill. Last Rx 08/2016 #15. Last OV 08/2016

## 2016-12-22 ENCOUNTER — Encounter: Payer: Self-pay | Admitting: Family Medicine

## 2016-12-22 ENCOUNTER — Other Ambulatory Visit: Payer: Self-pay | Admitting: Family Medicine

## 2016-12-22 NOTE — Telephone Encounter (Signed)
Last office visit 08/23/16.  Last refilled 08/23/16 for #15 with no refills.  Ok to refill?

## 2016-12-23 MED ORDER — PREDNISONE 20 MG PO TABS: ORAL_TABLET | ORAL | 0 refills | Status: AC

## 2016-12-23 NOTE — Telephone Encounter (Signed)
Reasonable to try   Prednisone 20 mg, 2 tabs po for 5 days, then 1 tab po for 5 days, #15, 0 ref  He should use compression over the next week - ideally a neoprene elbow sleeve. Not too tight. An ace bandage can also work.

## 2016-12-23 NOTE — Telephone Encounter (Signed)
I need more information.   Is he talking about the boggy swollen elbow at the point of the elbow?  Red? Warm? Pink? Painful?  Asymptomatic?

## 2017-02-15 ENCOUNTER — Other Ambulatory Visit: Payer: Self-pay | Admitting: Family Medicine

## 2017-08-30 ENCOUNTER — Other Ambulatory Visit: Payer: Self-pay | Admitting: Family Medicine

## 2017-11-02 ENCOUNTER — Telehealth: Payer: Self-pay | Admitting: Family Medicine

## 2017-11-02 NOTE — Telephone Encounter (Signed)
Copied from CRM [id number]. Topic: General - Other >> Nov 02, 2017  8:59 AM Cecelia ByarsGreen, Dineen Conradt L, RMA wrote: Reason for CRM: patient is requesting for Dr. Dayton MartesAron to send in a prescription for Prednisone to [facility] [place], [place],  he hurt his shoulder due to a fall, pt has not been seen since 08/23/16, please return pt call

## 2017-11-03 NOTE — Telephone Encounter (Signed)
I am so sorry but I cannot call in a prescription like that without seeing him first.

## 2017-11-03 NOTE — Telephone Encounter (Signed)
TA-Plz see pt note below/plz advise/thx dmf

## 2017-11-03 NOTE — Telephone Encounter (Signed)
LMOVM stating that would have to be seen for Rx/thx dmf
